# Patient Record
Sex: Male | Born: 1970 | Race: Black or African American | Hispanic: No | Marital: Single | Smoking: Former smoker
Health system: Southern US, Community
[De-identification: ages and names within clinical notes are randomized; demographics above are authoritative.]

## PROBLEM LIST (undated history)

## (undated) DIAGNOSIS — K859 Acute pancreatitis without necrosis or infection, unspecified: Secondary | ICD-10-CM

## (undated) DIAGNOSIS — K7460 Unspecified cirrhosis of liver: Secondary | ICD-10-CM

## (undated) DIAGNOSIS — I10 Essential (primary) hypertension: Secondary | ICD-10-CM

## (undated) HISTORY — PX: ESOPHAGOGASTRODUODENOSCOPY: SHX1529

---
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
## 2019-04-06 ENCOUNTER — Inpatient Hospital Stay (HOSPITAL_COMMUNITY): Payer: Non-veteran care

## 2019-04-06 ENCOUNTER — Inpatient Hospital Stay (HOSPITAL_COMMUNITY)
Admission: AD | Admit: 2019-04-06 | Discharge: 2019-04-24 | DRG: 314 | Disposition: A | Discharge status: Home Health | Payer: Non-veteran care | Source: Other Acute Inpatient Hospital | Attending: Internal Medicine | Admitting: Internal Medicine

## 2019-04-06 DIAGNOSIS — K852 Alcohol induced acute pancreatitis without necrosis or infection: Secondary | ICD-10-CM | POA: Diagnosis present

## 2019-04-06 DIAGNOSIS — K860 Alcohol-induced chronic pancreatitis: Secondary | ICD-10-CM | POA: Diagnosis present

## 2019-04-06 DIAGNOSIS — E872 Acidosis: Secondary | ICD-10-CM | POA: Diagnosis present

## 2019-04-06 DIAGNOSIS — K311 Adult hypertrophic pyloric stenosis: Secondary | ICD-10-CM | POA: Diagnosis present

## 2019-04-06 DIAGNOSIS — E876 Hypokalemia: Secondary | ICD-10-CM | POA: Diagnosis present

## 2019-04-06 DIAGNOSIS — I361 Nonrheumatic tricuspid (valve) insufficiency: Secondary | ICD-10-CM | POA: Diagnosis not present

## 2019-04-06 DIAGNOSIS — I10 Essential (primary) hypertension: Secondary | ICD-10-CM | POA: Diagnosis present

## 2019-04-06 DIAGNOSIS — E11649 Type 2 diabetes mellitus with hypoglycemia without coma: Secondary | ICD-10-CM | POA: Diagnosis not present

## 2019-04-06 DIAGNOSIS — A4102 Sepsis due to Methicillin resistant Staphylococcus aureus: Secondary | ICD-10-CM | POA: Diagnosis present

## 2019-04-06 DIAGNOSIS — E1165 Type 2 diabetes mellitus with hyperglycemia: Secondary | ICD-10-CM | POA: Diagnosis present

## 2019-04-06 DIAGNOSIS — F1010 Alcohol abuse, uncomplicated: Secondary | ICD-10-CM | POA: Diagnosis present

## 2019-04-06 DIAGNOSIS — K567 Ileus, unspecified: Secondary | ICD-10-CM | POA: Diagnosis present

## 2019-04-06 DIAGNOSIS — K7031 Alcoholic cirrhosis of liver with ascites: Secondary | ICD-10-CM | POA: Diagnosis present

## 2019-04-06 DIAGNOSIS — Z87891 Personal history of nicotine dependence: Secondary | ICD-10-CM | POA: Diagnosis not present

## 2019-04-06 DIAGNOSIS — Z20828 Contact with and (suspected) exposure to other viral communicable diseases: Secondary | ICD-10-CM | POA: Diagnosis present

## 2019-04-06 DIAGNOSIS — Y848 Other medical procedures as the cause of abnormal reaction of the patient, or of later complication, without mention of misadventure at the time of the procedure: Secondary | ICD-10-CM | POA: Diagnosis present

## 2019-04-06 DIAGNOSIS — D509 Iron deficiency anemia, unspecified: Secondary | ICD-10-CM | POA: Diagnosis present

## 2019-04-06 DIAGNOSIS — F1011 Alcohol abuse, in remission: Secondary | ICD-10-CM | POA: Diagnosis present

## 2019-04-06 DIAGNOSIS — Z4659 Encounter for fitting and adjustment of other gastrointestinal appliance and device: Secondary | ICD-10-CM

## 2019-04-06 DIAGNOSIS — A419 Sepsis, unspecified organism: Secondary | ICD-10-CM | POA: Diagnosis present

## 2019-04-06 DIAGNOSIS — I8510 Secondary esophageal varices without bleeding: Secondary | ICD-10-CM | POA: Diagnosis present

## 2019-04-06 DIAGNOSIS — Z803 Family history of malignant neoplasm of breast: Secondary | ICD-10-CM | POA: Diagnosis not present

## 2019-04-06 DIAGNOSIS — I339 Acute and subacute endocarditis, unspecified: Secondary | ICD-10-CM | POA: Diagnosis not present

## 2019-04-06 DIAGNOSIS — B9562 Methicillin resistant Staphylococcus aureus infection as the cause of diseases classified elsewhere: Secondary | ICD-10-CM | POA: Diagnosis not present

## 2019-04-06 DIAGNOSIS — K766 Portal hypertension: Secondary | ICD-10-CM | POA: Diagnosis present

## 2019-04-06 DIAGNOSIS — T80211A Bloodstream infection due to central venous catheter, initial encounter: Principal | ICD-10-CM | POA: Diagnosis present

## 2019-04-06 DIAGNOSIS — K863 Pseudocyst of pancreas: Secondary | ICD-10-CM | POA: Diagnosis present

## 2019-04-06 DIAGNOSIS — Z978 Presence of other specified devices: Secondary | ICD-10-CM

## 2019-04-06 DIAGNOSIS — I330 Acute and subacute infective endocarditis: Secondary | ICD-10-CM | POA: Diagnosis present

## 2019-04-06 DIAGNOSIS — K7460 Unspecified cirrhosis of liver: Secondary | ICD-10-CM | POA: Diagnosis present

## 2019-04-06 DIAGNOSIS — R7881 Bacteremia: Secondary | ICD-10-CM | POA: Diagnosis not present

## 2019-04-06 DIAGNOSIS — E871 Hypo-osmolality and hyponatremia: Secondary | ICD-10-CM | POA: Diagnosis present

## 2019-04-06 DIAGNOSIS — I342 Nonrheumatic mitral (valve) stenosis: Secondary | ICD-10-CM | POA: Diagnosis not present

## 2019-04-06 DIAGNOSIS — E119 Type 2 diabetes mellitus without complications: Secondary | ICD-10-CM

## 2019-04-06 DIAGNOSIS — E43 Unspecified severe protein-calorie malnutrition: Secondary | ICD-10-CM | POA: Diagnosis present

## 2019-04-06 DIAGNOSIS — K859 Acute pancreatitis without necrosis or infection, unspecified: Secondary | ICD-10-CM | POA: Diagnosis present

## 2019-04-06 HISTORY — DX: Essential (primary) hypertension: I10

## 2019-04-06 LAB — COMPREHENSIVE METABOLIC PANEL
ALT: 136 U/L — ABNORMAL HIGH (ref 0–44)
AST: 203 U/L — ABNORMAL HIGH (ref 15–41)
Albumin: 1.6 g/dL — ABNORMAL LOW (ref 3.5–5.0)
Alkaline Phosphatase: 109 U/L (ref 38–126)
Anion gap: 14 (ref 5–15)
BUN: 24 mg/dL — ABNORMAL HIGH (ref 6–20)
CO2: 15 mmol/L — ABNORMAL LOW (ref 22–32)
Calcium: 7.6 mg/dL — ABNORMAL LOW (ref 8.9–10.3)
Chloride: 92 mmol/L — ABNORMAL LOW (ref 98–111)
Creatinine, Ser: 1.16 mg/dL (ref 0.61–1.24)
GFR calc Af Amer: >60 mL/min (ref >60)
GFR calc non Af Amer: >60 mL/min (ref >60)
Glucose, Bld: 253 mg/dL — ABNORMAL HIGH (ref 70–99)
Potassium: 3.1 mmol/L — ABNORMAL LOW (ref 3.5–5.1)
Sodium: 121 mmol/L — ABNORMAL LOW (ref 135–145)
Total Bilirubin: 2.2 mg/dL — ABNORMAL HIGH (ref 0.3–1.2)
Total Protein: 6.6 g/dL (ref 6.5–8.1)

## 2019-04-06 LAB — DIFFERENTIAL
Abs Immature Granulocytes: 0 10*3/uL (ref 0.00–0.07)
Basophils Absolute: 0.2 10*3/uL — ABNORMAL HIGH (ref 0.0–0.1)
Basophils Relative: 1 %
Eosinophils Absolute: 0 10*3/uL (ref 0.0–0.5)
Eosinophils Relative: 0 %
Lymphocytes Relative: 3 %
Lymphs Abs: 0.7 10*3/uL (ref 0.7–4.0)
Monocytes Absolute: 0.4 10*3/uL (ref 0.1–1.0)
Monocytes Relative: 2 %
Neutro Abs: 20.7 10*3/uL — ABNORMAL HIGH (ref 1.7–7.7)
Neutrophils Relative %: 94 %
nRBC: 0 /100 WBC

## 2019-04-06 LAB — CBC
HCT: 27.1 % — ABNORMAL LOW (ref 39.0–52.0)
Hemoglobin: 8.8 g/dL — ABNORMAL LOW (ref 13.0–17.0)
MCH: 24.7 pg — ABNORMAL LOW (ref 26.0–34.0)
MCHC: 32.5 g/dL (ref 30.0–36.0)
MCV: 76.1 fL — ABNORMAL LOW (ref 80.0–100.0)
Platelets: 191 10*3/uL (ref 150–400)
RBC: 3.56 MIL/uL — ABNORMAL LOW (ref 4.22–5.81)
RDW: 19 % — ABNORMAL HIGH (ref 11.5–15.5)
WBC: 22 10*3/uL — ABNORMAL HIGH (ref 4.0–10.5)
nRBC: 0 % (ref 0.0–0.2)

## 2019-04-06 LAB — BETA-HYDROXYBUTYRIC ACID: Beta-Hydroxybutyric Acid: 0.17 mmol/L (ref 0.05–0.27)

## 2019-04-06 LAB — CORTISOL: Cortisol, Plasma: 33.7 ug/dL

## 2019-04-06 LAB — LIPASE, BLOOD: Lipase: 52 U/L — ABNORMAL HIGH (ref 11–51)

## 2019-04-06 LAB — GLUCOSE, CAPILLARY
Glucose-Capillary: 197 mg/dL — ABNORMAL HIGH (ref 70–99)
Glucose-Capillary: 235 mg/dL — ABNORMAL HIGH (ref 70–99)
Glucose-Capillary: 264 mg/dL — ABNORMAL HIGH (ref 70–99)

## 2019-04-06 LAB — HIV ANTIBODY (ROUTINE TESTING W REFLEX): HIV Screen 4th Generation wRfx: NONREACTIVE

## 2019-04-06 LAB — PHOSPHORUS: Phosphorus: 3.1 mg/dL (ref 2.5–4.6)

## 2019-04-06 LAB — MRSA PCR SCREENING: MRSA by PCR: POSITIVE — AB

## 2019-04-06 LAB — MAGNESIUM: Magnesium: 1.6 mg/dL — ABNORMAL LOW (ref 1.7–2.4)

## 2019-04-06 LAB — HEMOGLOBIN A1C
Hgb A1c MFr Bld: 5.8 % — ABNORMAL HIGH (ref 4.8–5.6)
Mean Plasma Glucose: 119.76 mg/dL

## 2019-04-06 LAB — AMYLASE: Amylase: 180 U/L — ABNORMAL HIGH (ref 28–100)

## 2019-04-06 MED ORDER — CHLORHEXIDINE GLUCONATE CLOTH 2 % EX PADS
6.0000 | MEDICATED_PAD | Freq: Every day | CUTANEOUS | Status: AC
Start: 2019-04-07 — End: 2019-04-12
  Administered 2019-04-09 – 2019-04-11 (×3): 6 via TOPICAL

## 2019-04-06 MED ORDER — ALTEPLASE 2 MG IJ SOLR
2.0000 mg | Freq: Once | INTRAMUSCULAR | Status: AC
  Administered 2019-04-07: 01:00:00 2 mg
  Filled 2019-04-06: qty 2

## 2019-04-06 MED ORDER — VANCOMYCIN HCL IN DEXTROSE 750-5 MG/150ML-% IV SOLN
750.0000 mg | Freq: Three times a day (TID) | INTRAVENOUS | Status: DC
  Administered 2019-04-06 – 2019-04-07 (×2): 750 mg via INTRAVENOUS
  Filled 2019-04-06 (×3): qty 150

## 2019-04-06 MED ORDER — FENTANYL CITRATE (PF) 100 MCG/2ML IJ SOLN
25.0000 ug | Freq: Once | INTRAMUSCULAR | Status: AC
  Administered 2019-04-06: 25 ug via INTRAVENOUS
  Filled 2019-04-06: qty 2

## 2019-04-06 MED ORDER — ENOXAPARIN SODIUM 40 MG/0.4ML ~~LOC~~ SOLN: 40.0000 mg | SUBCUTANEOUS | Status: DC

## 2019-04-06 MED ORDER — INSULIN ASPART 100 UNIT/ML ~~LOC~~ SOLN: 0.0000 [IU] | Freq: Four times a day (QID) | SUBCUTANEOUS | Status: DC

## 2019-04-06 MED ORDER — ACETAMINOPHEN 325 MG PO TABS: 650.0000 mg | ORAL_TABLET | ORAL | Status: DC | PRN

## 2019-04-06 MED ORDER — ONDANSETRON HCL 4 MG/2ML IJ SOLN
4.0000 mg | Freq: Four times a day (QID) | INTRAMUSCULAR | Status: DC | PRN
  Administered 2019-04-06: 19:00:00 4 mg via INTRAVENOUS
  Filled 2019-04-06: qty 2

## 2019-04-06 MED ORDER — MUPIROCIN 2 % EX OINT
1.0000 "application " | TOPICAL_OINTMENT | Freq: Two times a day (BID) | CUTANEOUS | Status: AC
  Administered 2019-04-06 – 2019-04-11 (×10): 1 via NASAL
  Filled 2019-04-06 (×3): qty 22

## 2019-04-06 MED ORDER — LACTATED RINGERS IV SOLN
INTRAVENOUS | Status: DC
  Administered 2019-04-06 – 2019-04-11 (×7): via INTRAVENOUS

## 2019-04-06 MED ORDER — SODIUM CHLORIDE 0.9 % IV SOLN
1.0000 g | Freq: Three times a day (TID) | INTRAVENOUS | Status: DC
  Administered 2019-04-07: 04:00:00 1 g via INTRAVENOUS
  Filled 2019-04-06 (×3): qty 1

## 2019-04-06 MED ORDER — HYDROMORPHONE HCL 1 MG/ML IJ SOLN
0.5000 mg | INTRAMUSCULAR | Status: DC | PRN
  Administered 2019-04-06 – 2019-04-09 (×17): 0.5 mg via INTRAVENOUS
  Filled 2019-04-06 (×17): qty 0.5

## 2019-04-06 MED ORDER — INSULIN ASPART 100 UNIT/ML ~~LOC~~ SOLN
0.0000 [IU] | SUBCUTANEOUS | Status: DC
  Administered 2019-04-06: 3 [IU] via SUBCUTANEOUS
  Administered 2019-04-06: 5 [IU] via SUBCUTANEOUS
  Administered 2019-04-07 (×2): 2 [IU] via SUBCUTANEOUS
  Administered 2019-04-07: 04:00:00 3 [IU] via SUBCUTANEOUS

## 2019-04-06 MED ORDER — SODIUM CHLORIDE 0.9 % IV SOLN
1.0000 g | Freq: Once | INTRAVENOUS | Status: AC
  Administered 2019-04-06: 1 g via INTRAVENOUS
  Filled 2019-04-06: qty 1

## 2019-04-06 MED ORDER — FAMOTIDINE IN NACL 20-0.9 MG/50ML-% IV SOLN
20.0000 mg | Freq: Two times a day (BID) | INTRAVENOUS | Status: DC
  Administered 2019-04-06: 20 mg via INTRAVENOUS
  Filled 2019-04-06: qty 50

## 2019-04-06 NOTE — Progress Notes (Signed)
Pharmacy Antibiotic Note  Charles Calhoun is a 48 y.o. male admitted on 04/06/2019 as a transfer from [facility] for higher level of care in setting of suspected sepsis, enlarging pancreatis pseudocysts, probable ileus. Pharmacy has been consulted for vancomycin dosing.  Per pharmacist at [facility], pt rec'd Zosyn and vancomycin (1 gram) IV at ~0800 today.  Plan: Vancomycin 750 mg IV Q 8 hrs Monitor renal function, WBC, temp, clinical improvement  Height: 6' (182.9 cm) Weight: 189 lb 6 oz (85.9 kg) IBW/kg (Calculated) : 77.6  Temp (24hrs), Avg:99.7 F (37.6 C), Min:99.3 F (37.4 C), Max:100 F (37.8 C)  Estimated Creatinine Clearance: 83.6 mL/min (by C-G formula based on SCr of 1.16 mg/dL).    No Known Allergies  Thanks for allowing pharmacy to be a part of this patient's care.  Excell Seltzer, PharmD Clinical Pharmacist

## 2019-04-06 NOTE — H&P (Addendum)
NAME:  Charles Calhoun, MRN:  [id number], DOB:  27-May-1971, LOS: 0 ADMISSION DATE:  04/06/2019, CONSULTATION DATE:  04/06/19 REFERRING MD:  Barnabas Harries, CHIEF COMPLAINT:  Abdominal pain  Brief History   48 yo M transferred from [facility] to [facility]U for higher level of care in setting of suspected sepsis, enlarging pancreatic pseudocysts.   History of present illness   48 yo M PMH EtOH abuse, Pancreatitis, who presented to [facility] with CC SOB. SOB began 2 days prior to presentation, onset gradually, and with associated symptoms of anorexia, abdominal pain, nausea, weakness and fever. Pain is dull in quality and moderate. No alleviating factors. Patient denies chest pain, vomiting, sick contacts, diarrhea, constipation.  CT abdomen concerning for acute pancreatitis and interval increase in size of pancreatic pseudocysts. Of note, patient with acute pancreatitis 9/26 for which he is receiving TPN via R PICC.  Glu 597, K 5.3, Ca 6.9, Sodium 112 AST 419 ALT 178 Alk phos 146 TBili 2.4  WBC 22,  Hgb  7.5 Lactic acid 6.0  PCT 15.85, BNP 142  Received vanc, zosyn at [facility]. Evaluated by surgeon who determined patient required higher level of care for medical management. Received 2 PRBC for acute anemia. Patient transferred to [facility].   Past Medical History  EtOH abuse Pancreatitis Pancreatic pseudocyst  DM with DKA   Significant Hospital Events   10/29 transferred from [facility] to [facility] MICU.  Consults:  Will need IR consult in AM.   Procedures:    Significant Diagnostic Tests:   10/29 CXR> R PICC. No acute pulmonary abnormalities   10/29 CT a/p at [facility] enlarging pancreatic pseudocysts (10.3 x 6.7 cm from 7.8 x 6cm) with new fluid collection along lesser curvature of stomach. Second collection near posterior main portal vein 5.6cm (previously 5.1cm) Localized mass effect on gastric antrum with fluid in stomach and esophagus-- suspicious for developing gastric outlet obstruction.  Increased peripancreatic stranding concerning for acute pancreatitis. Mild ascites.   Micro Data:  10/29 SARS Cov2> negative (performed at [facility])   Antimicrobials:  10/29 Vanc 10/29 Zosyn  10/29 Meropenem >>>   Interim history/subjective:  Arrives tachycardic but otherwise HDS On RA with some tachypnea Complaints of dull abdominal pain   Objective   Temperature 100 F (37.8 C), temperature source Oral, height 6' (1.829 m), weight 85.9 kg.       No intake or output data in the 24 hours ending 04/06/19 1647 Filed Weights   04/06/19 1500  Weight: 85.9 kg    Examination: General: Well-developed, thin adult male. Appears uncomfortable  HENT: Normocephalic, PERRL. Dry mucus membranes Neck: No JVD. Trachea midline. No thyromegaly, no lymphadenopathy CV: Tachycardic. RRR. S1S2. No MRG. +2 distal pulses Lungs: BBS present, clear, FNL, symmetrical. Tachyneic  ABD: Rounded, protuberant, hypoacitve BS x4. Tense. + pain to light palp diffusely, worse over epigastirc region. No masses or rigidity GU: No Foley EXT: MAE well. No edema. RUE PICC  Skin: Pale, WD. In tact. No rashes or lesions Neuro: A&Ox3. CN II-XII in tact. 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 Has been on TPN out pt since September 2020 /p d/c from [facility] for treatment of same.  P Continue NPO and IVF resuscitation  IR consult for perc drain 10/30  AM  NGT to [place] consult for TPN (arrives with existing PICC for TPN) Pharmacy consult for meropenem Lactic acid Procal  CMP CBC INR Pain management  KUB in AM  Hyperglycemia ?DKA at [place] Check BMP mag phos  Check beta hydroxybuteric acid  CBG q4   Hx EtOH abuse -outside of window for dts , no Hx dts   P -nutrition via TPN per pharmacy   At risk electrolyte abnormality P -Follow up BMP mag phos, replace as indicated  Best practice:  Diet: TPN per pharmacy Pain/Anxiety/Delirium protocol (if indicated): PRN dialudid VAP protocol (if indicated): na DVT prophylaxis: SCD GI prophylaxis: Pepcid Glucose control: SSI  Mobility: Up to chair Code Status: Full  Family Communication: patient updated at bedside  Disposition: transferred from [facility] to [facility]U   Labs   CBC: No results for input(s): WBC, NEUTROABS, HGB, HCT, MCV, PLT in the last 168 hours.  Basic Metabolic Panel: No results for input(s): NA, K, CL, CO2, GLUCOSE, BUN, CREATININE, CALCIUM, MG, PHOS in the last 168 hours. GFR: CrCl cannot be calculated (No successful lab value found.). No results for input(s): PROCALCITON, WBC, LATICACIDVEN in the last 168 hours.  Liver Function Tests: No results for input(s): AST, ALT, ALKPHOS, BILITOT, PROT, ALBUMIN in the last 168 hours. No results for input(s): LIPASE, AMYLASE in the last 168 hours. No results for input(s): AMMONIA in the last 168 hours.  ABG No results found for: PHART, PCO2ART, PO2ART, HCO3, TCO2, ACIDBASEDEF, O2SAT   Coagulation Profile: No results for input(s): INR, PROTIME in the last 168 hours.  Cardiac Enzymes: No results for input(s): CKTOTAL, CKMB, CKMBINDEX, TROPONINI in the last 168 hours.  HbA1C: No results found for: HGBA1C  CBG: Recent Labs  Lab 04/06/19 1645  GLUCAP 264*    Review of Systems:   As per HPI   Past Medical History  He,  has no past medical history on file.   Surgical History   EGD with bx of pancreatic mass  Social History     Hx ETOH abuse x 35 years, with cessation 01/2019 Family History   His family history is not on file.   Allergies No Known Allergies   Home Medications  Prior to Admission medications   Not on File     Critical care time: 45 min     Eliseo Gum MSN, AGACNP-BC [place] [phone] If no answer, [phone] 04/06/2019, 5:50 PM

## 2019-04-06 NOTE — Progress Notes (Signed)
Pharmacy Antibiotic Note  Charles Calhoun is a 48 y.o. male admitted on 04/06/2019 as a transfer from [facility] for higher level of care in setting of suspected sepsis, enlarging pancreatis pseudocysts, probable ileus. Pharmacy has been consulted for meropenem dosing.  Medical history includes: acute pancreatitis (03/04/19), EtOH abuse, DM with hx DKA  WBC 22, Scr 1.16, CrCl 83.6 ml/min, temp 100 F  Per pharmacist at [facility], pt rec'd Zosyn and vancomycin (1 gram) IV at ~0800 today.  Plan: Meropenem 1 gm IV Q 8 hrs Monitor renal function, WBC, temp, clinical improvement  Height: 6' (182.9 cm) Weight: 189 lb 6 oz (85.9 kg) IBW/kg (Calculated) : 77.6  Temp (24hrs), Avg:100 F (37.8 C), Min:100 F (37.8 C), Max:100 F (37.8 C)  Estimated Creatinine Clearance: 83.6 mL/min (by C-G formula based on SCr of 1.16 mg/dL).    No Known Allergies  Microbiology results: 10/29 BCx X 2: pending 10/29 COVID: negative (at OSH)   Thank you for allowing pharmacy to be a part of this patient's care.  Gillermina Hu, PharmD, BCPS, [facility] Clinical Pharmacist 04/06/2019 6:08 PM

## 2019-04-07 ENCOUNTER — Inpatient Hospital Stay (HOSPITAL_COMMUNITY): Payer: Non-veteran care

## 2019-04-07 ENCOUNTER — Encounter (HOSPITAL_COMMUNITY): Payer: Self-pay | Admitting: Infectious Diseases

## 2019-04-07 DIAGNOSIS — K863 Pseudocyst of pancreas: Secondary | ICD-10-CM

## 2019-04-07 DIAGNOSIS — B9562 Methicillin resistant Staphylococcus aureus infection as the cause of diseases classified elsewhere: Secondary | ICD-10-CM | POA: Diagnosis present

## 2019-04-07 DIAGNOSIS — I340 Nonrheumatic mitral (valve) insufficiency: Secondary | ICD-10-CM

## 2019-04-07 DIAGNOSIS — E871 Hypo-osmolality and hyponatremia: Secondary | ICD-10-CM

## 2019-04-07 DIAGNOSIS — K859 Acute pancreatitis without necrosis or infection, unspecified: Secondary | ICD-10-CM

## 2019-04-07 DIAGNOSIS — A419 Sepsis, unspecified organism: Secondary | ICD-10-CM

## 2019-04-07 DIAGNOSIS — K7460 Unspecified cirrhosis of liver: Secondary | ICD-10-CM

## 2019-04-07 DIAGNOSIS — K861 Other chronic pancreatitis: Secondary | ICD-10-CM

## 2019-04-07 DIAGNOSIS — E440 Moderate protein-calorie malnutrition: Secondary | ICD-10-CM

## 2019-04-07 DIAGNOSIS — R7881 Bacteremia: Principal | ICD-10-CM | POA: Diagnosis present

## 2019-04-07 HISTORY — DX: Unspecified cirrhosis of liver: K74.60

## 2019-04-07 HISTORY — DX: Acute pancreatitis without necrosis or infection, unspecified: K85.90

## 2019-04-07 LAB — URINE CULTURE: Culture: NO GROWTH

## 2019-04-07 LAB — COMPREHENSIVE METABOLIC PANEL
ALT: 90 U/L — ABNORMAL HIGH (ref 0–44)
AST: 107 U/L — ABNORMAL HIGH (ref 15–41)
Albumin: 1.3 g/dL — ABNORMAL LOW (ref 3.5–5.0)
Alkaline Phosphatase: 82 U/L (ref 38–126)
Anion gap: 9 (ref 5–15)
BUN: 17 mg/dL (ref 6–20)
CO2: 17 mmol/L — ABNORMAL LOW (ref 22–32)
Calcium: 6.8 mg/dL — ABNORMAL LOW (ref 8.9–10.3)
Chloride: 103 mmol/L (ref 98–111)
Creatinine, Ser: 0.72 mg/dL (ref 0.61–1.24)
GFR calc Af Amer: >60 mL/min (ref >60)
GFR calc non Af Amer: >60 mL/min (ref >60)
Glucose, Bld: 136 mg/dL — ABNORMAL HIGH (ref 70–99)
Potassium: 2.9 mmol/L — ABNORMAL LOW (ref 3.5–5.1)
Sodium: 129 mmol/L — ABNORMAL LOW (ref 135–145)
Total Bilirubin: 1.5 mg/dL — ABNORMAL HIGH (ref 0.3–1.2)
Total Protein: 5.3 g/dL — ABNORMAL LOW (ref 6.5–8.1)

## 2019-04-07 LAB — BASIC METABOLIC PANEL
Anion gap: 6 (ref 5–15)
Anion gap: 8 (ref 5–15)
BUN: 14 mg/dL (ref 6–20)
BUN: 12 mg/dL (ref 6–20)
CO2: 23 mmol/L (ref 22–32)
CO2: 21 mmol/L — ABNORMAL LOW (ref 22–32)
Calcium: 7.7 mg/dL — ABNORMAL LOW (ref 8.9–10.3)
Calcium: 7.5 mg/dL — ABNORMAL LOW (ref 8.9–10.3)
Chloride: 100 mmol/L (ref 98–111)
Chloride: 100 mmol/L (ref 98–111)
Creatinine, Ser: 0.76 mg/dL (ref 0.61–1.24)
Creatinine, Ser: 0.78 mg/dL (ref 0.61–1.24)
GFR calc Af Amer: >60 mL/min (ref >60)
GFR calc Af Amer: >60 mL/min (ref >60)
GFR calc non Af Amer: >60 mL/min (ref >60)
GFR calc non Af Amer: >60 mL/min (ref >60)
Glucose, Bld: 129 mg/dL — ABNORMAL HIGH (ref 70–99)
Glucose, Bld: 120 mg/dL — ABNORMAL HIGH (ref 70–99)
Potassium: 4.2 mmol/L (ref 3.5–5.1)
Potassium: 4.3 mmol/L (ref 3.5–5.1)
Sodium: 129 mmol/L — ABNORMAL LOW (ref 135–145)
Sodium: 129 mmol/L — ABNORMAL LOW (ref 135–145)

## 2019-04-07 LAB — DIFFERENTIAL
Abs Immature Granulocytes: 0.15 10*3/uL — ABNORMAL HIGH (ref 0.00–0.07)
Basophils Absolute: 0 10*3/uL (ref 0.0–0.1)
Basophils Relative: 0 %
Eosinophils Absolute: 0 10*3/uL (ref 0.0–0.5)
Eosinophils Relative: 0 %
Immature Granulocytes: 1 %
Lymphocytes Relative: 5 %
Lymphs Abs: 0.6 10*3/uL — ABNORMAL LOW (ref 0.7–4.0)
Monocytes Absolute: 1.4 10*3/uL — ABNORMAL HIGH (ref 0.1–1.0)
Monocytes Relative: 12 %
Neutro Abs: 9.2 10*3/uL — ABNORMAL HIGH (ref 1.7–7.7)
Neutrophils Relative %: 82 %

## 2019-04-07 LAB — CBC
HCT: 22.6 % — ABNORMAL LOW (ref 39.0–52.0)
HCT: 23.8 % — ABNORMAL LOW (ref 39.0–52.0)
Hemoglobin: 7.1 g/dL — ABNORMAL LOW (ref 13.0–17.0)
Hemoglobin: 7.6 g/dL — ABNORMAL LOW (ref 13.0–17.0)
MCH: 24.1 pg — ABNORMAL LOW (ref 26.0–34.0)
MCH: 24.4 pg — ABNORMAL LOW (ref 26.0–34.0)
MCHC: 31.4 g/dL (ref 30.0–36.0)
MCHC: 31.9 g/dL (ref 30.0–36.0)
MCV: 76.6 fL — ABNORMAL LOW (ref 80.0–100.0)
MCV: 76.5 fL — ABNORMAL LOW (ref 80.0–100.0)
Platelets: 181 10*3/uL (ref 150–400)
Platelets: 175 10*3/uL (ref 150–400)
RBC: 2.95 MIL/uL — ABNORMAL LOW (ref 4.22–5.81)
RBC: 3.11 MIL/uL — ABNORMAL LOW (ref 4.22–5.81)
RDW: 19 % — ABNORMAL HIGH (ref 11.5–15.5)
RDW: 18.8 % — ABNORMAL HIGH (ref 11.5–15.5)
WBC: 11.3 10*3/uL — ABNORMAL HIGH (ref 4.0–10.5)
WBC: 10.4 10*3/uL (ref 4.0–10.5)
nRBC: 0 % (ref 0.0–0.2)
nRBC: 0 % (ref 0.0–0.2)

## 2019-04-07 LAB — BLOOD CULTURE ID PANEL (REFLEXED)

## 2019-04-07 LAB — GLUCOSE, CAPILLARY
Glucose-Capillary: 151 mg/dL — ABNORMAL HIGH (ref 70–99)
Glucose-Capillary: 140 mg/dL — ABNORMAL HIGH (ref 70–99)
Glucose-Capillary: 125 mg/dL — ABNORMAL HIGH (ref 70–99)
Glucose-Capillary: 119 mg/dL — ABNORMAL HIGH (ref 70–99)
Glucose-Capillary: 116 mg/dL — ABNORMAL HIGH (ref 70–99)

## 2019-04-07 LAB — PREALBUMIN: Prealbumin: <5 mg/dL — ABNORMAL LOW (ref 18–38)

## 2019-04-07 LAB — CALCIUM, IONIZED: Calcium, Ionized, Serum: 4.5 mg/dL (ref 4.5–5.6)

## 2019-04-07 LAB — PHOSPHORUS: Phosphorus: 1.7 mg/dL — ABNORMAL LOW (ref 2.5–4.6)

## 2019-04-07 LAB — ECHOCARDIOGRAM COMPLETE
Height: 72 in
Weight: 3033.53 oz

## 2019-04-07 LAB — PROCALCITONIN: Procalcitonin: 31.27 ng/mL

## 2019-04-07 LAB — TRIGLYCERIDES: Triglycerides: 139 mg/dL (ref <150)

## 2019-04-07 LAB — MAGNESIUM: Magnesium: 1.6 mg/dL — ABNORMAL LOW (ref 1.7–2.4)

## 2019-04-07 MED ORDER — MAGNESIUM SULFATE 2 GM/50ML IV SOLN
2.0000 g | Freq: Once | INTRAVENOUS | Status: AC
  Administered 2019-04-07: 2 g via INTRAVENOUS
  Filled 2019-04-07: qty 50

## 2019-04-07 MED ORDER — POTASSIUM PHOSPHATES 15 MMOLE/5ML IV SOLN
30.0000 mmol | Freq: Once | INTRAVENOUS | Status: AC
  Administered 2019-04-07: 17:00:00 30 mmol via INTRAVENOUS
  Filled 2019-04-07: qty 10

## 2019-04-07 MED ORDER — ENOXAPARIN SODIUM 40 MG/0.4ML ~~LOC~~ SOLN
40.0000 mg | SUBCUTANEOUS | Status: DC
  Administered 2019-04-08 – 2019-04-23 (×16): 40 mg via SUBCUTANEOUS
  Filled 2019-04-07 (×17): qty 0.4

## 2019-04-07 MED ORDER — SODIUM CHLORIDE 0.9 % IV SOLN
2.0000 g | INTRAVENOUS | Status: DC
  Administered 2019-04-07 – 2019-04-16 (×10): 2 g via INTRAVENOUS
  Filled 2019-04-07: qty 20
  Filled 2019-04-07 (×4): qty 2
  Filled 2019-04-07 (×6): qty 20

## 2019-04-07 MED ORDER — THIAMINE HCL 100 MG/ML IJ SOLN
100.0000 mg | Freq: Every day | INTRAMUSCULAR | Status: DC
  Administered 2019-04-07 – 2019-04-12 (×6): 100 mg via INTRAVENOUS
  Filled 2019-04-07 (×6): qty 2

## 2019-04-07 MED ORDER — METRONIDAZOLE IN NACL 5-0.79 MG/ML-% IV SOLN
500.0000 mg | Freq: Three times a day (TID) | INTRAVENOUS | Status: DC
  Administered 2019-04-07 – 2019-04-17 (×30): 500 mg via INTRAVENOUS
  Filled 2019-04-07 (×30): qty 100

## 2019-04-07 MED ORDER — PANTOPRAZOLE SODIUM 40 MG IV SOLR
40.0000 mg | Freq: Two times a day (BID) | INTRAVENOUS | Status: DC
  Administered 2019-04-07 – 2019-04-24 (×35): 40 mg via INTRAVENOUS
  Filled 2019-04-07 (×35): qty 40

## 2019-04-07 MED ORDER — POTASSIUM CHLORIDE 10 MEQ/50ML IV SOLN
10.0000 meq | INTRAVENOUS | Status: AC
  Administered 2019-04-07 (×8): 10 meq via INTRAVENOUS
  Filled 2019-04-07 (×8): qty 50

## 2019-04-07 MED ORDER — VANCOMYCIN HCL 10 G IV SOLR
1500.0000 mg | Freq: Two times a day (BID) | INTRAVENOUS | Status: DC
Start: 2019-04-07 — End: 2019-04-18
  Administered 2019-04-07 – 2019-04-18 (×21): 1500 mg via INTRAVENOUS
  Filled 2019-04-07 (×24): qty 1500

## 2019-04-07 NOTE — Progress Notes (Signed)
PHARMACY - ADULT TOTAL PARENTERAL NUTRITION CONSULT NOTE   Pharmacy Consult for TPN Indication: pancreatitis  Patient Measurements: Height: 6' (182.9 cm) Weight: 189 lb 9.5 oz (86 kg) IBW/kg (Calculated) : 77.6 TPN AdjBW (KG): 85.9 Body mass index is 25.71 kg/m.  Assessment:  48 year old male presented with abdominal pain and vomiting. He has a history of alcohol abuse and cirrhosis. He complained of abdominal pain despite being NPO. Found to have pancreatic pseudocyst, decision was made to treat patient conservatively with NPO status and TPN for 6 weeks. TPN was initiated on 03/04/19 with potential end date of 04/15/19. Patient's care has been at [facility] in [place], [place]. Patient was transferred to [facility] on 10/29 after becoming septic and finding that the pseudocysts have become enlarged.  GI: On home TPN through [facility] in [place] Endo: cbgs 140-235 Insulin requirements in the past 24 hours: 11 units ssi Lytes:  Renal: Pulm: On RA Cards:  Hepatobil: AST/ALT 203/136 > 107/90, Tbili 1.5 Neuro: A&O ID: MRSA bacteremia on vancomycin, flagyl, ceftriaxone  TPN Access: PICC to be pulled for bacteremia, will need new PICC prior to restarting TPN TPN start date:  Nutritional Goals (per RD recommendation on ): kCal: Protein:  Fluid:   Current Nutrition:  NPO  Plan:  -Due to bacteremia, PICC will be pulled, hold TPN until blood cultures are negative x 48 hours -Awaiting TPN prescription to be faxed from [place]   Abrian Hanover, Jake Church 04/07/2019,9:52 AM

## 2019-04-07 NOTE — Progress Notes (Signed)
Initial Nutrition Assessment  DOCUMENTATION CODES:   Not applicable  INTERVENTION:   Resume TPN when PICC replaced. TPN to meet nutrition needs while bowel rest is required.   NUTRITION DIAGNOSIS:   Increased nutrient needs related to acute illness as evidenced by estimated needs.  GOAL:   Patient will meet greater than or equal to 90% of their needs  MONITOR:   Labs, Weight trends, I & O's  REASON FOR ASSESSMENT:   Consult New TPN/TNA  ASSESSMENT:   48 yo male admitted from [facility] with sepsis, enlarging pancreatic pseudocysts. PMH includes alcoholic pancreatitis, DM, pancreatic pseudocyst (receiving home TPN since 03/04/2019).   IR consulted for drainage of pseudocysts. IR recommends GI consult for possible gastrocystostomy.  PICC is being removed due to MRSA bacteremia. Receiving IV antibiotics. TPN to be resumed when new PICC is placed.   Labs reviewed. Sodium 129 (L), potassium 2.9 (L), phosphorus 1.7 (L), magnesium 1.6 (L), prealbumin <5 (L) CBG's: 151-140  Medications reviewed and include novolog, protonix, thiamine, KCl.    NUTRITION - FOCUSED PHYSICAL EXAM:  unable to complete  Diet Order:   Diet Order            Diet NPO time specified  Diet effective now              EDUCATION NEEDS:   Not appropriate for education at this time  Skin:  Skin Assessment: Reviewed RN Assessment  Last BM:  10/29  Height:   Ht Readings from Last 1 Encounters:  04/06/19 6' (1.829 m)    Weight:   Wt Readings from Last 1 Encounters:  04/07/19 86 kg    Ideal Body Weight:  80.9 kg  BMI:  Body mass index is 25.71 kg/m.  Estimated Nutritional Needs:   Kcal:  9417-4081  Protein:  125-135 gm  Fluid:  >/= 2.4 L    Molli Barrows, RD, LDN, [place] Pager [phone] After Hours Pager [phone]

## 2019-04-07 NOTE — Progress Notes (Signed)
NG tube pulled out as patient was trying to turn. RN will attempt to reinsert tube.

## 2019-04-07 NOTE — Consult Note (Signed)
[facility]    Date of Admission:  04/06/2019     Total days of antibiotics 2  Day 2 vancomycin   Day 1 Ceftriaxone + Metronidazole                 Reason for Consult: MRSA Bacteremia / Pancreatitis     Referring Provider: CHAMP  Primary Care Provider: No primary care provider on file.    Assessment: Charles Calhoun is a 48 y.o. male male transferred to [facility] for management of complex pancreatic pseudocyst. He was found to have MRSA bacteremia at arrival to [facility] on 10/29 in the setting of chronic PICC x 30d. On exam the PICC does not appear infected however it would be considered contaminated and recommend pulling the line and allowing 48h min holiday with repeat cultures to be drawn after removal.   Work up for staphylococcus aureus bacteremia he will also need a transthoracic echocardiogram to assess for endocarditis - on exam he has no murmur (although quite tachycardic presently), no implanted cardiac devices or valves. No artificial joints or other hardware and no signs/symptoms of other metastatic sites of infection. Source most likely related to PICC line; alt possibly related to intraabdominal process, although suspect more GNR/polymicrobial here. Repeat blood cultures ordered to prove clearance. Hold on PICC line for now.   Will change meropenem to ceftriaxone + metronidazole and await for GI team to see with regards to endoscopic cyst gastrostomy. Please send any material for anaerobic/aerobic and fungal cultures to adjust therapy.    Plan: 1. Please pull PICC line and allow for 48h holiday (pending clearance of BCx 2. Please draw blood cultures after PICC line removed 3. Needs GI consult for consideration of endocscopic cyst gastrostomy   4. Continue Vancomycin for MRSA bacteremia 5. Change Meropenem to Ceftriaxone + Metronidazole  6. Transthoracic echo for Staph Bacteremia  7. TPN to be held until we can replace line   Principal  Problem:   MRSA bacteremia Active Problems:   Pancreatitis   Pancreatic pseudocyst   Sepsis (HCC)   Cirrhosis (HCC)    Chlorhexidine Gluconate Cloth  6 each Topical Q0600   [START ON 04/08/2019] enoxaparin (LOVENOX) injection  40 mg Subcutaneous Q24H   insulin aspart  0-15 Units Subcutaneous Q4H   mupirocin ointment  1 application Nasal BID   pantoprazole (PROTONIX) IV  40 mg Intravenous Q12H   thiamine injection  100 mg Intravenous Daily    HPI: Charles Calhoun is a 48 y.o. male [facility] overnight for management of pancreatitis with pseudocyst.   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   Presented to [facility] on 10/29 with chief complaint SOB x 1 week. He was feeling worse and worse and at the urging of his girlfriend he went to the hospital. Review of their records revealed tachycardia 160s, febrile to 102.4 F, hypotension SBP < 90. He improved mildly after IVF and starting broad spectrum antibiotics.   Abdominal CT W Contrast 10/29 - Liver normal size without any gross evidence of masses or dilated ducts. GB unremarkable without stranding. Mild to moderate peripancreatic stranding that is minimally increased compared to study on 9/23. Peripherally enhancing fluid collection along superior mid pancreatic body that is increased to 10.3 x 6.7cm (previously 7.8 x 6cm). Localized mass  effect on gastric antrum. Peripancreatic adenopathy unchanged, likely reactive. Increased gastric wall thickening with small fluid collections along lesser curvature. He is on vancomycin + meropenem.   He states the he had an admission at the mid/end of September for the same problem and has had a double lumen PICC line in the right arm receiving cyclic TNA for about a month. He has not had any  trouble with pain/swelling or tenderness/drainage to this site.   He is passing urine and stool normally. High volume NGT output and +belly pain. No vomiting/nausea. No skin break down. No cardiac devices or hardware of any sort. Denies any back/joint pain.   Review of Systems: Review of Systems  Constitutional: Positive for chills and fever. Negative for malaise/fatigue.  Eyes: Negative for blurred vision and double vision.  Respiratory: Positive for shortness of breath. Negative for cough.   Cardiovascular: Negative for chest pain and leg swelling.  Gastrointestinal: Positive for abdominal pain. Negative for nausea and vomiting.  Genitourinary: Negative for dysuria.  Musculoskeletal: Negative for back pain and joint pain.  Skin: Negative for rash.  Neurological: Positive for dizziness and weakness.    Past Medical History:  Diagnosis Date   Cirrhosis (HCC) 04/07/2019   Pancreatitis 04/07/2019    Social History   Tobacco Use   Smoking status: Not on file  Substance Use Topics   Alcohol use: Not on file   Drug use: Not on file    Family History  Problem Relation Age of Onset   Liver cancer Neg Hx    No Known Allergies  OBJECTIVE: Blood pressure 109/69, pulse (!) 144, temperature 100.1 F (37.8 C), temperature source Oral, resp. rate (!) 26, height 6' (1.829 m), weight 86 kg, SpO2 98 %.  Physical Exam Vitals signs and nursing note reviewed.  Constitutional:      Appearance: Normal appearance.     Comments: Sitting upright in bed. Appears mildly uncomfortable.   HENT:     Mouth/Throat:     Mouth: Mucous membranes are moist.     Pharynx: Oropharynx is clear.  Eyes:     General: No scleral icterus.    Pupils: Pupils are equal, round, and reactive to light.  Cardiovascular:     Rate and Rhythm: Regular rhythm. Tachycardia present.     Heart sounds: No murmur.  Pulmonary:     Effort: Pulmonary effort is normal.     Breath sounds: Normal breath sounds. No  rales.  Abdominal:     General: Bowel sounds are normal.     Palpations: Abdomen is soft.     Comments: NGT in place with brown/bilious output  Musculoskeletal:        General: No tenderness.     Right lower leg: No edema.     Left lower leg: No edema.  Skin:    General: Skin is warm and dry.     Capillary Refill: Capillary refill takes less than 2 seconds.     Comments: RUE DL PICC in place without pain/tenderenss. Dressing dry with Biopatch in place. No obvious purulence.   Neurological:     Mental Status: He is alert and oriented to person, place, and time.  Psychiatric:        Mood and Affect: Mood normal.        Behavior: Behavior normal.     Lab Results Lab Results  Component Value Date   WBC 11.3 (H) 04/07/2019   HGB 7.1 (L) 04/07/2019   HCT 22.6 (L) 04/07/2019  MCV 76.6 (L) 04/07/2019   PLT 181 04/07/2019    Lab Results  Component Value Date   CREATININE 0.72 04/07/2019   BUN 17 04/07/2019   NA 129 (L) 04/07/2019   K 2.9 (L) 04/07/2019   CL 103 04/07/2019   CO2 17 (L) 04/07/2019    Lab Results  Component Value Date   ALT 90 (H) 04/07/2019   AST 107 (H) 04/07/2019   ALKPHOS 82 04/07/2019   BILITOT 1.5 (H) 04/07/2019     Microbiology: Recent Results (from the past 240 hour(s))  Culture, blood (routine x 2)     Status: None (Preliminary result)   Collection Time: 04/06/19  5:25 PM   Specimen: Site Not Specified; Blood  Result Value Ref Range Status   Specimen Description SITE NOT SPECIFIED  Final   Special Requests AEROBIC BOTTLE ONLY Blood Culture adequate volume  Final   Culture  Setup Time   Final    GRAM POSITIVE COCCI IN CLUSTERS AEROBIC BOTTLE ONLY CRITICAL VALUE NOTED.  VALUE IS CONSISTENT WITH PREVIOUSLY REPORTED AND CALLED VALUE.    Culture   Final    NO GROWTH < 24 HOURS Performed at [facility], [address] [place], [place] [address]    Report Status PENDING  Incomplete  Culture, blood (routine x 2)     Status: None  (Preliminary result)   Collection Time: 04/06/19  5:25 PM   Specimen: Site Not Specified; Blood  Result Value Ref Range Status   Specimen Description SITE NOT SPECIFIED  Final   Special Requests AEROBIC BOTTLE ONLY Blood Culture adequate volume  Final   Culture  Setup Time   Final    GRAM POSITIVE COCCI IN CLUSTERS AEROBIC BOTTLE ONLY Organism ID to follow CRITICAL RESULT CALLED TO, READ BACK BY AND VERIFIED WITH: Gala Lewandowsky. Baumeister PharmD 9:15 04/07/19 ([email or web address])    Culture   Final    NO GROWTH < 24 HOURS Performed at [facility], [address] [place], [place] [address]    Report Status PENDING  Incomplete  Blood Culture ID Panel (Reflexed)     Status: Abnormal   Collection Time: 04/06/19  5:25 PM  Result Value Ref Range Status   Enterococcus species NOT DETECTED NOT DETECTED Final   Listeria monocytogenes NOT DETECTED NOT DETECTED Final   Staphylococcus species DETECTED (A) NOT DETECTED Final    Comment: CRITICAL RESULT CALLED TO, READ BACK BY AND VERIFIED WITH: Gala Lewandowsky. Baumeister PharmD 9:15 04/07/19 ([email or web address])    Staphylococcus aureus (BCID) DETECTED (A) NOT DETECTED Final    Comment: Methicillin (oxacillin)-resistant Staphylococcus aureus (MRSA). MRSA is predictably resistant to beta-lactam antibiotics (except ceftaroline). Preferred therapy is vancomycin unless clinically contraindicated. Patient requires contact precautions if  hospitalized. CRITICAL RESULT CALLED TO, READ BACK BY AND VERIFIED WITH: Gala Lewandowsky. Baumeister PharmD 9:15 04/07/19 ([email or web address])    Methicillin resistance DETECTED (A) NOT DETECTED Final    Comment: CRITICAL RESULT CALLED TO, READ BACK BY AND VERIFIED WITH: Gala Lewandowsky. Baumeister PharmD 9:15 04/07/19 ([email or web address])    Streptococcus species NOT DETECTED NOT DETECTED Final   Streptococcus agalactiae NOT DETECTED NOT DETECTED Final   Streptococcus pneumoniae NOT DETECTED NOT DETECTED Final   Streptococcus pyogenes NOT DETECTED NOT DETECTED Final   Acinetobacter baumannii NOT  DETECTED NOT DETECTED Final   Enterobacteriaceae species NOT DETECTED NOT DETECTED Final   Enterobacter cloacae complex NOT DETECTED NOT DETECTED Final   Escherichia coli NOT DETECTED NOT DETECTED Final   Klebsiella oxytoca NOT DETECTED NOT DETECTED  Final   Klebsiella pneumoniae NOT DETECTED NOT DETECTED Final   Proteus species NOT DETECTED NOT DETECTED Final   Serratia marcescens NOT DETECTED NOT DETECTED Final   Haemophilus influenzae NOT DETECTED NOT DETECTED Final   Neisseria meningitidis NOT DETECTED NOT DETECTED Final   Pseudomonas aeruginosa NOT DETECTED NOT DETECTED Final   Candida albicans NOT DETECTED NOT DETECTED Final   Candida glabrata NOT DETECTED NOT DETECTED Final   Candida krusei NOT DETECTED NOT DETECTED Final   Candida parapsilosis NOT DETECTED NOT DETECTED Final   Candida tropicalis NOT DETECTED NOT DETECTED Final    Comment: Performed at [facility], [place] [address] [place], [place] [address]  MRSA PCR Screening     Status: Abnormal   Collection Time: 04/06/19  7:05 PM   Specimen: Nasal Mucosa; Nasopharyngeal  Result Value Ref Range Status   MRSA by PCR POSITIVE (A) NEGATIVE Final    Comment:        The GeneXpert MRSA Assay (FDA approved for NASAL specimens only), is one component of a comprehensive MRSA colonization surveillance program. It is not intended to diagnose MRSA infection nor to guide or monitor treatment for MRSA infections. RESULT CALLED TO, READ BACK BY AND VERIFIED WITH: Floy Sabina RN 04/06/19 2102 JDW Performed at [facility], [place] [address] [place], [place] [address]     Janene Madeira, MSN, NP-C [facility] [email or web address] Pager: [phone] Office: [phone] [place]: [phone]

## 2019-04-07 NOTE — Progress Notes (Signed)
NAME:  Charles Calhoun, MRN:  [id number], DOB:  08-Jul-1970, LOS: 1 ADMISSION DATE:  04/06/2019, CONSULTATION DATE:  04/06/19 REFERRING MD:  Barnabas Harries, CHIEF COMPLAINT:  Abdominal pain  Brief History   48 yo M transferred from [facility] to [facility] MICU for higher level of care in setting of suspected sepsis, enlarging pancreatic pseudocysts.   History of present illness   48 yo M PMH EtOH abuse, Pancreatitis, who presented to [facility] with CC SOB. SOB began 2 days prior to presentation, onset gradually, and with associated symptoms of anorexia, abdominal pain, nausea, weakness and fever. Pain is dull in quality and moderate. No alleviating factors. Patient denies chest pain, vomiting, sick contacts, diarrhea, constipation.  CT abdomen concerning for acute pancreatitis and interval increase in size of pancreatic pseudocysts. Of note, patient with acute pancreatitis 9/26 for which he is receiving TPN via R PICC.  Glu 597, K 5.3, Ca 6.9, Sodium 112 AST 419 ALT 178 Alk phos 146 TBili 2.4  WBC 22,  Hgb  7.5 Lactic acid 6.0  PCT 15.85, BNP 142  Received vanc, zosyn at [facility]. Evaluated by surgeon who determined patient required higher level of care for medical management. Received 2 PRBC for acute anemia. Patient transferred to [facility] MICU.   Past Medical History  EtOH abuse- last drink 2 months ago Pancreatitis Pancreatic pseudocyst  DM with DKA   Significant Hospital Events   10/29 transferred from [facility] to [facility] MICU.  Consults:  Will need IR consult in AM.   Procedures:    Significant Diagnostic Tests:   10/29 CXR> R PICC. No acute pulmonary abnormalities   10/29 CT a/p at [facility] enlarging pancreatic pseudocysts (10.3 x 6.7 cm from 7.8 x 6cm) with new fluid collection along lesser curvature of stomach. Second collection near posterior main portal vein 5.6cm (previously 5.1cm) Localized mass effect on gastric antrum with fluid in stomach and esophagus-- suspicious for developing  gastric outlet obstruction. Increased peripancreatic stranding concerning for acute pancreatitis. Mild ascites.   Micro Data:  10/29 SARS Cov2> negative (performed at [facility])   Antimicrobials:  10/29 Vanc 10/29 Meropenem x 1 dose  Ceftriaxone 10/30>>  Interim history/subjective:  PICC will be removed this afternoon due to staph aureus bacteremia.  Currently denies abdominal pain or nausea.  Objective   Blood pressure 99/61, pulse (!) 142, temperature 100.1 F (37.8 C), temperature source Oral, resp. rate (!) 28, height 6' (1.829 m), weight 86 kg, SpO2 93 %.        Intake/Output Summary (Last 24 hours) at 04/07/2019 1258 Last data filed at 04/07/2019 1000 Gross per 24 hour  Intake 1102.35 ml  Output 1950 ml  Net -847.65 ml   Filed Weights   04/06/19 1500 04/07/19 0500  Weight: 85.9 kg 86 kg    Examination: General: Chronically ill-appearing, thin.  Lying in bed no acute distress HENT: [place]/AT, eyes anicteric  neck: No JVD CV:  Tachycardic, regular rhythm.  Sinus tachycardia on telemetry.  No murmurs.   Lungs: Breathing comfortably on room air, mild tachypnea.  No accessory muscle use.  Clear to auscultation bilaterally.   ABD:  Protuberant abdomen, mildly distended.  Soft, minimally tender to palpation EXT: Right upper extremity PICC without erythema or drainage.  No redness along the tract.  No significant peripheral edema Skin: No rashes or wounds Neuro: Alert, answering questions appropriately, normal speech.  Face symmetric, moving all extremities spontaneously.   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 Has been on TPN out pt since September 2020 /p d/c from [facility] for treatment of same.   Sepsis due to likely infected pancreatic pseudocyst vs Staph  aureus bacteremia.  Although his PICC does not appear infected, this is likely the source of bacteremia. -IR evaluating for possible drainage; needs to be cultured if drained -Continue broad-spectrum antibiotics -Blood cultures pending -Echocardiogram -Appreciate infectious disease team's assistance -PICC to be removed today; will hold TPN and secure through peripheral IVs for line holiday  Likely ileus -Continue n.p.o.  Hyperglycemia-well-controlled -Continue Accu-Cheks every 4 hours with sliding scale insulin as needed -Goal BG 140-180 while admitted  Hx EtOH abuse; last drink 2 months ago P -Will restart TPN once central access is available -IV thiamine  Hyponatremia-corrected by 8 mg/dL over 12 hours -Serial BMPs, every 6 hours -Continue LR at current rate; may need to adjust based on sodium levels  Hypokalemia -replete & to monitor  Non-anion gap metabolic acidosis, possibly due to hyperalimentation -Continue to monitor  Acute on chronic anemia; drop overnight does not appear dilutional -Serial CBCs -Type and screen this afternoon -Will transfuse for hemoglobin less than 7 or active bleeding with hemodynamic instability  Sinus tachycardia due to critical illness -Continue to monitor   Best practice:  Diet: TPN per pharmacy- on hold Pain/Anxiety/Delirium protocol (if indicated): PRN dialudid VAP protocol (if indicated): na DVT prophylaxis: SCD; due to hemoglobin drop chemical DVT prophylaxis is contraindicated currently GI prophylaxis: PPI Glucose control: SSI  Mobility: Up to chair Code Status: Full  Family Communication: patient updated at bedside  Disposition: ICU  Labs   CBC: Recent Labs  Lab 04/06/19 1840 04/07/19 0420  WBC 22.0* 11.3*  NEUTROABS 20.7* 9.2*  HGB 8.8* 7.1*  HCT 27.1* 22.6*  MCV 76.1* 76.6*  PLT 191 244    Basic Metabolic Panel: Recent Labs  Lab 04/06/19 1830 04/07/19 0421  NA 121* 129*  K 3.1* 2.9*  CL 92* 103  CO2 15*  17*  GLUCOSE 253* 136*  BUN 24* 17  CREATININE 1.16 0.72  CALCIUM 7.6* 6.8*  MG 1.6* 1.6*  PHOS 3.1 1.7*   GFR: Estimated Creatinine Clearance: 121.3 mL/min (by C-G formula based on SCr of 0.72 mg/dL). Recent Labs  Lab 04/06/19 1830 04/06/19 1840 04/07/19 0420  PROCALCITON 31.27  --   --   WBC  --  22.0* 11.3*    Liver Function Tests: Recent Labs  Lab 04/06/19 1830 04/07/19 0421  AST 203* 107*  ALT 136* 90*  ALKPHOS 109 82  BILITOT 2.2* 1.5*  PROT 6.6 5.3*  ALBUMIN 1.6* 1.3*   Recent Labs  Lab 04/06/19 1830  LIPASE 52*  AMYLASE 180*   No results for input(s): AMMONIA in the last 168 hours.  ABG No results found for: PHART, PCO2ART, PO2ART, HCO3, TCO2, ACIDBASEDEF, O2SAT   Coagulation Profile: No results for input(s): INR, PROTIME in the last 168 hours.  Cardiac Enzymes: No results for input(s): CKTOTAL, CKMB, CKMBINDEX, TROPONINI in the last 168 hours.  HbA1C: Hgb A1c MFr Bld  Date/Time Value Ref Range Status  04/06/2019 06:40 PM 5.8 (H) 4.8 - 5.6 % Final    Comment:    (NOTE) Pre diabetes:          5.7%-6.4% Diabetes:              >  6.4% Glycemic control for   <7.0% adults with diabetes     CBG: Recent Labs  Lab 04/06/19 2042 04/06/19 2332 04/07/19 0337 04/07/19 0742 04/07/19 1134  GLUCAP 235* 197* 151* 140* 125*     This patient is critically ill with multiple organ system failure which requires frequent high complexity decision making, assessment, support, evaluation, and titration of therapies. This was completed through the application of advanced monitoring technologies and extensive interpretation of multiple databases. During this encounter critical care time was devoted to patient care services described in this note for 45 minutes.

## 2019-04-07 NOTE — Progress Notes (Signed)
Echocardiogram 2D Echocardiogram has been performed.  Oneal Deputy Shaniqwa Horsman 04/07/2019, 1:58 PM

## 2019-04-07 NOTE — Progress Notes (Signed)
PHARMACY - PHYSICIAN COMMUNICATION CRITICAL VALUE ALERT - BLOOD CULTURE IDENTIFICATION (BCID)  Charles Calhoun is an 48 y.o. male who presented to [facility] on 04/06/2019 from [facility] with a chief complaint of abdominal pain with associated pancreatitis and pseudocysts  Assessment:  2/2 aerobic bottles positive for MRSA  Name of physician (or Provider) Contacted: Dr. Baxter Flattery  Current antibiotics: Vancomycin and meropenem  Changes to prescribed antibiotics recommended:  Will discontinue meropenem and initiate ceftriaxone and flagyl. Continue the vancomycin  No results found for this or any previous visit.  [name] 04/07/2019  9:11 AM

## 2019-04-07 NOTE — Consult Note (Signed)
Chief Complaint: Patient was seen in consultation today for pancreatic pseudocysts  Referring Physician(s): Dr. Lynetta Mare  Supervising Physician: Markus Daft  Patient Status: [facility] - In-pt  History of Present Illness: Charles Calhoun is a 48 y.o. male with history of cirrhosis, alcohol abuse, pancreatitis who was admitted in September to [facility] for abdominal pain and vomiting.  He was wound to have pancreatic pseudocyst treated conservatively with NGT, TPN for several weeks. Patient was transferred to [facility] overnight due to concern for sepsis as well as reported enlargement of his pseudocysts.   Patient assessed at bedside this AM.  He is lying on his side under all his covers.  Follows commands and answers all questions, however seems to not fully understand why he is at [facility] or what the goals of his treatment plan are. States he does not have belly pain currently and that his abdomen is always "big, but now it's bigger." NGT in place. No TPN at present although his PICC line remains.   WBC improved today to 11.0. Afebrile.   IR consulted for pancreatic pseudocyst aspiration and drainage.   No past medical history on file.  Allergies: Patient has no known allergies.  Medications: Prior to Admission medications   Medication Sig Start Date End Date Taking? Authorizing Provider  sterile water SOLN with amino acids 10 % SOLN 1.3 g/kg, dextrose 70 % SOLN 20 % Inject into the vein continuous.    [provider]     No family history on file.  Social History   Socioeconomic History  . Marital status: Not on file    Spouse name: Not on file  . Number of children: Not on file  . Years of education: Not on file  . Highest education level: Not on file  Occupational History  . Not on file  Social Needs  . Financial resource strain: Not on file  . Food insecurity    Worry: Not on file    Inability: Not on file  . Transportation needs    Medical: Not on file    Non-medical:  Not on file  Tobacco Use  . Smoking status: Not on file  Substance and Sexual Activity  . Alcohol use: Not on file  . Drug use: Not on file  . Sexual activity: Not on file  Lifestyle  . Physical activity    Days per week: Not on file    Minutes per session: Not on file  . Stress: Not on file  Relationships  . Social Herbalist on phone: Not on file    Gets together: Not on file    Attends religious service: Not on file    Active member of club or organization: Not on file    Attends meetings of clubs or organizations: Not on file    Relationship status: Not on file  Other Topics Concern  . Not on file  Social History Narrative  . Not on file     Review of Systems: A 12 point ROS discussed and pertinent positives are indicated in the HPI above.  All other systems are negative.  Review of Systems  Constitutional: Positive for fever. Negative for fatigue.  Respiratory: Negative for cough and shortness of breath.   Cardiovascular: Negative for chest pain.  Gastrointestinal: Positive for abdominal pain, nausea and vomiting.  Genitourinary: Negative for dysuria.  Musculoskeletal: Negative for back pain.  Psychiatric/Behavioral: Negative for behavioral problems.    Vital Signs: BP 108/64   Pulse [name])  140   Temp 99.6 F (37.6 C) (Oral)   Resp (!) 32   Ht 6' (1.829 m)   Wt 189 lb 9.5 oz (86 kg)   SpO2 94%   BMI 25.71 kg/m   Physical Exam Vitals signs and nursing note reviewed.  Constitutional:      Appearance: Normal appearance.  HENT:     Mouth/Throat:     Mouth: Mucous membranes are moist.     Pharynx: Oropharynx is clear.  Cardiovascular:     Rate and Rhythm: Normal rate and regular rhythm.     Pulses: Normal pulses.  Pulmonary:     Effort: Pulmonary effort is normal. No respiratory distress.     Breath sounds: Normal breath sounds.  Abdominal:     General: There is distension.     Tenderness: There is no abdominal tenderness. There is no  guarding.  Skin:    General: Skin is warm and dry.  Neurological:     Mental Status: He is alert.  Psychiatric:        Mood and Affect: Mood normal.        Behavior: Behavior normal.        Thought Content: Thought content normal.        Judgment: Judgment normal.          Imaging: Dg Chest Port 1 View  Result Date: 04/06/2019 CLINICAL DATA:  Tachypnea. EXAM: PORTABLE CHEST 1 VIEW COMPARISON:  None. FINDINGS: Enteric tube coiled in the stomach. Right upper extremity PICC line with the tip at the cavoatrial junction. The heart size and mediastinal contours are within normal limits. Normal pulmonary vascularity. Low lung volumes. Minimal atelectasis at the peripheral left lung base. No focal consolidation, pleural effusion, or pneumothorax. No acute osseous abnormality. IMPRESSION: 1. Low lung volumes.  No active disease. Electronically Signed   By: Obie DredgeWilliam T Derry M.D.   On: 04/06/2019 18:21   Dg Abd Portable 1v  Result Date: 04/07/2019 CLINICAL DATA:  Abdominal distension. EXAM: PORTABLE ABDOMEN - 1 VIEW COMPARISON:  None. FINDINGS: An NG tube is noted in the stomach. Relative paucity of bowel gas except for moderate cecal distention. The soft tissue shadows are maintained. No obvious free air. The bony structures are intact. 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 IMPRESSION: 1. NG tube in the stomach. 2. Moderate cecal distention. Could not exclude a cecal volvulus. CT may be helpful for further evaluation. 3. Right hip AVN. Electronically Signed   By: Rudie MeyerP.  Gallerani M.D.   On: 04/07/2019 05:20    Labs:  CBC: Recent Labs    04/06/19 1840 04/07/19 0420  WBC 22.0* 11.3*  HGB 8.8* 7.1*  HCT 27.1* 22.6*  PLT 191 181    COAGS: No results for input(s): INR, APTT in the last 8760 hours.  BMP: Recent Labs    04/06/19 1830 04/07/19 0421  NA 121* 129*  K 3.1* 2.9*  CL 92* 103  CO2 15* 17*  GLUCOSE 253* 136*  BUN 24* 17  CALCIUM 7.6* 6.8*  CREATININE 1.16  0.72  GFRNONAA >60 >60  GFRAA >60 >60    LIVER FUNCTION TESTS: Recent Labs    04/06/19 1830 04/07/19 0421  BILITOT 2.2* 1.5*  AST 203* 107*  ALT 136* 90*  ALKPHOS 109 82  PROT 6.6 5.3*  ALBUMIN 1.6* 1.3*    TUMOR MARKERS: No results for input(s): AFPTM, CEA, CA199, CHROMGRNA in the last 8760 hours.  Assessment and Plan: Pancreatic pseudocysts Patient transferred  from [facility] to [facility] for concern for sepsis and enlargement of his pancreatic pseudocysts.  His vital signs are currently stable.   His WBC improved overnight.  Now on merrem and vancomycin.  IR consulted for drainage of his pseudocysts.  Case reviewed by Dr. Lowella Dandy who recommends consultation with GI for possible gastrocystostomy. Aspiration for sampling/culture if clinically indicated.  He did have blood cultures positive for Staph and is being treated for line infection.  IR available if needed.    Thank you for this interesting consult.  I greatly enjoyed meeting Khaidyn Staebell and look forward to participating in their care.  A copy of this report was sent to the requesting provider on this date.  Electronically Signed: Hoyt Koch, PA 04/07/2019, 11:30 AM   I spent a total of 40 Minutes    in face to face in clinical consultation, greater than 50% of which was counseling/coordinating care for pancreatic pseudocyst

## 2019-04-07 NOTE — Progress Notes (Signed)
NG tube was reinserted successfully . Patient tolerated well. Xray ordered to confirm placement.

## 2019-04-07 NOTE — Progress Notes (Signed)
[facility] ADULT ICU REPLACEMENT PROTOCOL FOR AM LAB REPLACEMENT ONLY  The patient does apply for the [facility] Adult ICU Electrolyte Replacment Protocol based on the criteria listed below:   1. Is GFR >/= 40 ml/min? Yes.    Patient's GFR today is >60 2. Is urine output >/= 0.5 ml/kg/hr for the last 6 hours? Yes.   Patient's UOP is .9 ml/kg/hr 3. Is BUN < 60 mg/dL? Yes.    Patient's BUN today is 17 4. Abnormal electrolyte(s): K-2.8 5. Ordered repletion with: per protocol 6. If a panic level lab has been reported, has the CCM MD in charge been notified? Yes.  .   Physician:  Dr. Harlene Ramus, Philis Nettle 04/07/2019 6:48 AM

## 2019-04-07 NOTE — Progress Notes (Signed)
Pharmacy Antibiotic Note  Charles Calhoun is a 48 y.o. male admitted on 04/06/2019 as a transfer from [facility] for higher level of care in setting of suspected sepsis from pancreatitis and enlarging pancreatic pseudocysts. Pharmacy is consulted for vancomycin for MRSA bacteremia. Blood cultures on 04/06/2019 are positive for 2/2 GPC in clusters and BCID identified MRSA. Patient has been receiving vancomycin and meropenem and now will change antibiotic regimen to vancomycin, ceftriaxone, and metronidazole. Procalcitonin 31.27. WBC 11.3. Tmax 100.1.   Patient has been receiving vancomycin 750mg  IV q8h. Scr has improved from 1.16 to 0.8. With vancomycin 750mg  IV q8h and current Scr of 0.72 rounded to 0.8, predicted AUC of 345 is subtherapeutic (goal 400-550).  Vancomycin 1500 mg IV Q 12 hrs. Goal AUC 400-550. Expected AUC: 461 SCr used: 0.8    Plan: Change vancomycin to 1500mg  IV q12h (next dose at 1500 on 10/30; 8 hours from last dose of vancomycin 750mg ) Monitor renal function, cultures/sensitivites, and clinical progression  Height: 6' (182.9 cm) Weight: 189 lb 9.5 oz (86 kg) IBW/kg (Calculated) : 77.6  Temp (24hrs), Avg:99.5 F (37.5 C), Min:99.2 F (37.3 C), Max:100 F (37.8 C)  Recent Labs  Lab 04/06/19 1830 04/06/19 1840 04/07/19 0420 04/07/19 0421  WBC  --  22.0* 11.3*  --   CREATININE 1.16  --   --  0.72    Estimated Creatinine Clearance: 121.3 mL/min (by C-G formula based on SCr of 0.72 mg/dL).    No Known Allergies  Antimicrobials this admission: Meropenem 10/29 >> 10/30 Zosyn 10/29 at OSH  Vanc 10/29 >>  CTX 10/30>> Flagyl 10/30>>   Dose adjustments this admission: 10/30: change vancomycin 750mg  IV q8h to vancomycin 1500mg  IV q12h due to improvement in renal function  Microbiology results: 10/30 Ucx:  10/30 Bcx: 2/2 GCP in clusters  10/30 BCID: MRSA  10/29 MRSA PCR positive   Thank you for allowing pharmacy to be a part of this patient's  care.  Cristela Felt, PharmD PGY1 Pharmacy Resident [employer]: [phone]   04/07/2019 11:31 AM

## 2019-04-07 NOTE — Progress Notes (Signed)
E-link RN and MD made aware about heart rate. Orders given to continue to monitor. Patient is resting comfortably. Denies any chest pain or discomfort.

## 2019-04-08 ENCOUNTER — Inpatient Hospital Stay (HOSPITAL_COMMUNITY): Payer: Non-veteran care

## 2019-04-08 ENCOUNTER — Encounter (HOSPITAL_COMMUNITY): Payer: Self-pay | Admitting: Radiology

## 2019-04-08 DIAGNOSIS — E119 Type 2 diabetes mellitus without complications: Secondary | ICD-10-CM

## 2019-04-08 DIAGNOSIS — F1010 Alcohol abuse, uncomplicated: Secondary | ICD-10-CM | POA: Diagnosis present

## 2019-04-08 DIAGNOSIS — R7881 Bacteremia: Secondary | ICD-10-CM

## 2019-04-08 DIAGNOSIS — K8522 Alcohol induced acute pancreatitis with infected necrosis: Secondary | ICD-10-CM

## 2019-04-08 DIAGNOSIS — B9562 Methicillin resistant Staphylococcus aureus infection as the cause of diseases classified elsewhere: Secondary | ICD-10-CM

## 2019-04-08 DIAGNOSIS — Z598 Other problems related to housing and economic circumstances: Secondary | ICD-10-CM

## 2019-04-08 DIAGNOSIS — K862 Cyst of pancreas: Secondary | ICD-10-CM

## 2019-04-08 DIAGNOSIS — Z9989 Dependence on other enabling machines and devices: Secondary | ICD-10-CM

## 2019-04-08 DIAGNOSIS — K7031 Alcoholic cirrhosis of liver with ascites: Secondary | ICD-10-CM

## 2019-04-08 LAB — BASIC METABOLIC PANEL
Anion gap: 8 (ref 5–15)
Anion gap: 9 (ref 5–15)
BUN: 10 mg/dL (ref 6–20)
BUN: 8 mg/dL (ref 6–20)
CO2: 21 mmol/L — ABNORMAL LOW (ref 22–32)
CO2: 22 mmol/L (ref 22–32)
Calcium: 7.5 mg/dL — ABNORMAL LOW (ref 8.9–10.3)
Calcium: 7.5 mg/dL — ABNORMAL LOW (ref 8.9–10.3)
Chloride: 99 mmol/L (ref 98–111)
Chloride: 99 mmol/L (ref 98–111)
Creatinine, Ser: 0.71 mg/dL (ref 0.61–1.24)
Creatinine, Ser: 0.66 mg/dL (ref 0.61–1.24)
GFR calc Af Amer: >60 mL/min (ref >60)
GFR calc Af Amer: >60 mL/min (ref >60)
GFR calc non Af Amer: >60 mL/min (ref >60)
GFR calc non Af Amer: >60 mL/min (ref >60)
Glucose, Bld: 109 mg/dL — ABNORMAL HIGH (ref 70–99)
Glucose, Bld: 90 mg/dL (ref 70–99)
Potassium: 4.1 mmol/L (ref 3.5–5.1)
Potassium: 3.7 mmol/L (ref 3.5–5.1)
Sodium: 128 mmol/L — ABNORMAL LOW (ref 135–145)
Sodium: 130 mmol/L — ABNORMAL LOW (ref 135–145)

## 2019-04-08 LAB — CBC
HCT: 24 % — ABNORMAL LOW (ref 39.0–52.0)
Hemoglobin: 7.5 g/dL — ABNORMAL LOW (ref 13.0–17.0)
MCH: 24 pg — ABNORMAL LOW (ref 26.0–34.0)
MCHC: 31.3 g/dL (ref 30.0–36.0)
MCV: 76.9 fL — ABNORMAL LOW (ref 80.0–100.0)
Platelets: 150 10*3/uL (ref 150–400)
RBC: 3.12 MIL/uL — ABNORMAL LOW (ref 4.22–5.81)
RDW: 19.2 % — ABNORMAL HIGH (ref 11.5–15.5)
WBC: 7.4 10*3/uL (ref 4.0–10.5)
nRBC: 0 % (ref 0.0–0.2)

## 2019-04-08 LAB — GLUCOSE, CAPILLARY
Glucose-Capillary: 103 mg/dL — ABNORMAL HIGH (ref 70–99)
Glucose-Capillary: 108 mg/dL — ABNORMAL HIGH (ref 70–99)
Glucose-Capillary: 136 mg/dL — ABNORMAL HIGH (ref 70–99)
Glucose-Capillary: 65 mg/dL — ABNORMAL LOW (ref 70–99)
Glucose-Capillary: 78 mg/dL (ref 70–99)
Glucose-Capillary: 99 mg/dL (ref 70–99)

## 2019-04-08 LAB — PHOSPHORUS: Phosphorus: 2.1 mg/dL — ABNORMAL LOW (ref 2.5–4.6)

## 2019-04-08 LAB — MAGNESIUM: Magnesium: 2 mg/dL (ref 1.7–2.4)

## 2019-04-08 MED ORDER — IOHEXOL 300 MG/ML  SOLN
100.0000 mL | Freq: Once | INTRAMUSCULAR | Status: AC | PRN
  Administered 2019-04-08: 100 mL via INTRAVENOUS

## 2019-04-08 MED ORDER — DEXTROSE 5 % IV SOLN
INTRAVENOUS | Status: DC
  Administered 2019-04-08 – 2019-04-10 (×3): via INTRAVENOUS

## 2019-04-08 MED ORDER — DEXTROSE 50 % IV SOLN
INTRAVENOUS | Status: AC
  Administered 2019-04-08: 50 mL
  Filled 2019-04-08: qty 50

## 2019-04-08 NOTE — Consult Note (Signed)
Referring Provider: No ref. provider found Primary Care Physician:  No primary care provider on file. Primary Gastroenterologist: Althia Forts   Reason for Consultation:   Pancreatic pseudocyst   HPI: Charles Calhoun is a 48 y.o. male with a past medical history of DM II, alcohol abuse and alcoholic pancreatitis.  He was diagnosed with acute pancreatitis 03/04/2019 which required TPN via right PICC line. Followed by [facility]. He presented to [facility] hospital with complaints of shortness of breath for 2 days with abdominal pain, nausea, weakness and fever.  An abdominal/pelvic CT 10/29 identified an enlarging pancreatic pseudocyst with new fluid collections, see CT results below.  His WBC was 22.  Hemoglobin 7.5.  Lactic acid 6.0.  A chest x-ray was negative.  SARS Covid 2 negative. + MRSA nasal swab. He was tachycardic and required fluid resuscitation.  He received 2 units of packed red blood cells at [facility] for acute anemia.  He was transferred to [facility] 04/06/2019 for further GI evaluation, possible cyst gastrostomy.  Interventional radiology reviewed his abdominal/pelvic CT scan and assessed that his fluid collections were not amenable to percutaneous drainage.  He is currently resting quietly in bed.  He just returned from having a repeat abdominal/pelvic CT.  The results are pending.  He continues to have left upper and left lower quadrant abdominal pain.  He has slight nausea.  No vomiting.  His NG tube has drained approximately 300 cc of brown bilious drainage.  He reported passing a small bowel movement yesterday that was brown without blood or melena.  He reported a few days ago he passed a formed black stool.  No bright red rectal bleeding.  He denies having any chest pain or palpitations.  No alcohol use for the past 2 months.  No drug use.  He remains n.p.o.  No family history of pancreatic, upper GI or colorectal cancer.  His mother had breast cancer.  Labs 04/07/2019: Sodium  129.  Potassium 2.9.  Glucose 136.  BUN 17.  Magnesium 1.  6.  Alk phos 82.  Albumin 1.3.  AST 107.  ALT 98.  Total bili 1.5.  Triglyceride 139.  WBC 11.3.  Hemoglobin 7.1.  Hematocrit 22.6.  MCV 76.6.  Platelet 181.  10/29 CXR> R PICC. No acute pulmonary abnormalities   10/29 CT a/p at [facility]: enlarging pancreatic pseudocysts (10.3 x 6.7 cm from 7.8 x 6cm) with new fluid collection along lesser curvature of stomach. Second collection near posterior main portal vein 5.6cm (previously 5.1cm) Localized mass effect on gastric antrum with fluid in stomach and esophagus-- suspicious for developing gastric outlet obstruction. Increased peripancreatic stranding concerning for acute pancreatitis. Mild ascites.   ECHO 04/07/2019: 1. Left ventricular ejection fraction, by visual estimation, is 60 to 65%. The left ventricle has normal function. There is no left ventricular hypertrophy. 2. Global right ventricle has normal systolic function.The right ventricular size is normal. No increase in right ventricular wall thickness. 3. Left atrial size was normal. 4. Right atrial size was normal. 5. The mitral valve is normal in structure. Mild mitral valve regurgitation. No evidence of mitral stenosis. 6. The tricuspid valve is normal in structure. Tricuspid valve regurgitation is trivial. 7. The aortic valve is normal in structure. Aortic valve regurgitation is not visualized. No evidence of aortic valve sclerosis or stenosis. 8. The pulmonic valve was normal in structure. Pulmonic valve regurgitation is not visualized. 9. The inferior vena cava is normal in size with greater than 50% respiratory variability, suggesting  right atrial pressure of 3 mmHg. 10. Limited quality study but no clear evidence for an endocarditis.  Past Medical History:  Diagnosis Date  . Cirrhosis (Home) 04/07/2019  . Pancreatitis 04/07/2019   No past surgical history   Prior to Admission medications   Medication Sig Start Date  End Date Taking? Authorizing Provider  acetaminophen (TYLENOL) 325 MG tablet Take 650 mg by mouth every 6 (six) hours as needed for mild pain, fever or headache.   Yes [provider]  folic acid (FOLVITE) 481 MCG tablet Take 400 mcg by mouth daily.   Yes [provider]  metoprolol succinate (TOPROL-XL) 25 MG 24 hr tablet Take 25 mg by mouth daily.   Yes [provider]  ondansetron (ZOFRAN) 4 MG tablet Take 8 mg by mouth every 8 (eight) hours as needed for nausea/vomiting.   Yes [provider]  PRESCRIPTION MEDICATION Inject 2,250 mLs into the vein every 12 (twelve) hours. amino acid 10% (AMINOSYN II) 10 % SolP, D70W SolP with sodium chloride 23.4% 4 mEq/mL SolP, fat emulsion 20 % Emul 300 mL   Yes [provider]    Current Facility-Administered Medications  Medication Dose Route Frequency Provider Last Rate Last Dose  . acetaminophen (TYLENOL) tablet 650 mg  650 mg Oral Q4H PRN Bowser, Laurel Dimmer, NP      . cefTRIAXone (ROCEPHIN) 2 g in sodium chloride 0.9 % 100 mL IVPB  2 g Intravenous Q24H Carlyle Basques, MD   Stopped at 04/07/19 1226  . Chlorhexidine Gluconate Cloth 2 % PADS 6 each  6 each Topical Q0600 Agarwala, Ravi, MD      . enoxaparin (LOVENOX) injection 40 mg  40 mg Subcutaneous Q24H Docia Barrier, PA      . HYDROmorphone (DILAUDID) injection 0.5 mg  0.5 mg Intravenous Q2H PRN Alfonzo Feller, NP   0.5 mg at 04/08/19 0947  . insulin aspart (novoLOG) injection 0-15 Units  0-15 Units Subcutaneous Q4H Alfonzo Feller, NP   2 Units at 04/07/19 1148  . lactated ringers infusion   Intravenous Continuous Julian Hy, DO 75 mL/hr at 04/08/19 1112    . metroNIDAZOLE (FLAGYL) IVPB 500 mg  500 mg Intravenous Eldred Manges, MD 100 mL/hr at 04/08/19 0500    . mupirocin ointment (BACTROBAN) 2 % 1 application  1 application Nasal BID Kipp Brood, MD   1 application at 85/63/14 0946  . ondansetron (ZOFRAN) injection 4 mg  4 mg  Intravenous Q6H PRN Cristal Generous, NP   4 mg at 04/06/19 1836  . pantoprazole (PROTONIX) injection 40 mg  40 mg Intravenous Q12H Noemi Chapel P, DO   40 mg at 04/08/19 0945  . thiamine (B-1) injection 100 mg  100 mg Intravenous Daily Noemi Chapel P, DO   100 mg at 04/08/19 0945  . vancomycin (VANCOCIN) 1,500 mg in sodium chloride 0.9 % 500 mL IVPB  1,500 mg Intravenous Q12H Henri Medal, RPH 250 mL/hr at 04/08/19 0500      Allergies as of 04/06/2019  . (No Known Allergies)    Family History  Problem Relation Age of Onset  . Liver cancer Neg Hx     Social History   Socioeconomic History  . Marital status: Single    Spouse name: Not on file  . Number of children: Not on file  . Years of education: Not on file  . Highest education level: Not on file  Occupational History  . Not on file  Social Needs  . Financial resource strain: Not on file  . Food insecurity    Worry: Not on file    Inability: Not on file  . Transportation needs    Medical: Not on file    Non-medical: Not on file  Tobacco Use  . Smoking status: Not on file  Substance and Sexual Activity  . Alcohol use: Not on file  . Drug use: Not on file  . Sexual activity: Not on file  Lifestyle  . Physical activity    Days per week: Not on file    Minutes per session: Not on file  . Stress: Not on file  Relationships  . Social Herbalist on phone: Not on file    Gets together: Not on file    Attends religious service: Not on file    Active member of club or organization: Not on file    Attends meetings of clubs or organizations: Not on file    Relationship status: Not on file  . Intimate partner violence    Fear of current or ex partner: Not on file    Emotionally abused: Not on file    Physically abused: Not on file    Forced sexual activity: Not on file  Other Topics Concern  . Not on file  Social History Narrative  . Not on file    Review of Systems: Gen: Denies fever, sweats or chills.  No weight loss.  CV: Denies chest pain, palpitations or edema. Resp: + cough, no SOB. GI: See HPI.  He is having any nausea or heartburn. GU : Denies urinary burning, blood in urine, increased urinary frequency or incontinence. MS: Denies joint pain, muscles aches or weakness. Derm: Denies rash, itchiness, skin lesions or unhealing ulcers. Psych: Denies depression, anxiety, memory loss, suicidal ideation and confusion. Heme: Denies bruising, bleeding. Neuro:  Denies headaches, dizziness or paresthesias. Endo:  Denies any problems with DM, thyroid or adrenal function.  Physical Exam: Vital signs in last 24 hours: Temp:  [100.8 F (38.2 C)] 100.8 F (38.2 C) (10/30 1522) Pulse Rate:  [129-147] 131 (10/31 1100) Resp:  [21-30] 26 (10/31 1100) BP: (98-142)/(61-89) 109/75 (10/31 1100) SpO2:  [93 %-100 %] 97 % (10/31 1100) Weight:  [86.1 kg] 86.1 kg (10/31 0500) Last BM Date: 04/06/19 General:   Alert in no acute distress, resting in bed with the sheets over his head. Head:  Normocephalic and atraumatic. Eyes:  Sclera clear, no icterus. Conjunctiva pink. Ears:  Normal auditory acuity. Nose:  No deformity, discharge or lesions. Mouth:  No deformity or lesions.  Poor dentition. Neck:  Supple. Lungs: Scattered expiratory wheezes throughout. Heart: Tachycardic, no murmurs. Abdomen: Abdomen is distended but not tense, hypoactive bowel sounds auscultated when the NG tube was clamped, moderate left upper quadrant and left lower quadrant tenderness without rebound or guarding, no HSM.  Umbilical hernia. Rectal: Patient declined rectal exam. Msk:  Symmetrical without gross deformities. . Pulses:  Normal pulses noted. Extremities:  Without clubbing or edema. Neurologic:  Alert and  oriented x4;  grossly normal neurologically. Skin:  Intact without significant lesions or rashes.  Skin  is dry and flaky. Psych:  Alert and cooperative. Normal mood and affect.  Intake/Output from previous day:  10/30 0701 - 10/31 0700 In: 5300.5 [I.V.:3011.8; IV Piggyback:2288.7] Out: 2175 [Urine:1575; Emesis/NG output:600] Intake/Output this shift: Total I/O In: -  Out: 300 [Emesis/NG output:300]  Lab Results: Recent Labs    04/06/19 1840 04/07/19 0420 04/07/19  1329  WBC 22.0* 11.3* 10.4  HGB 8.8* 7.1* 7.6*  HCT 27.1* 22.6* 23.8*  PLT 191 181 175   BMET Recent Labs    04/07/19 1329 04/07/19 1945 04/08/19 0111  NA 129* 129* 128*  K 4.2 4.3 4.1  CL 100 100 99  CO2 23 21* 21*  GLUCOSE 129* 120* 109*  BUN '14 12 10  ' CREATININE 0.76 0.78 0.71  CALCIUM 7.7* 7.5* 7.5*   LFT Recent Labs    04/07/19 0421  PROT 5.3*  ALBUMIN 1.3*  AST 107*  ALT 90*  ALKPHOS 82  BILITOT 1.5*   PT/INR No results for input(s): LABPROT, INR in the last 72 hours. Hepatitis Panel No results for input(s): HEPBSAG, HCVAB, HEPAIGM, HEPBIGM in the last 72 hours.  Studies/Results: Dg Chest Port 1 View  Result Date: 04/06/2019 CLINICAL DATA:  Tachypnea. EXAM: PORTABLE CHEST 1 VIEW COMPARISON:  None. FINDINGS: Enteric tube coiled in the stomach. Right upper extremity PICC line with the tip at the cavoatrial junction. The heart size and mediastinal contours are within normal limits. Normal pulmonary vascularity. Low lung volumes. Minimal atelectasis at the peripheral left lung base. No focal consolidation, pleural effusion, or pneumothorax. No acute osseous abnormality. IMPRESSION: 1. Low lung volumes.  No active disease. Electronically Signed   By: Titus Dubin M.D.   On: 04/06/2019 18:21   Dg Abd Portable 1v  Result Date: 04/08/2019 CLINICAL DATA:  NG tube placement EXAM: PORTABLE ABDOMEN - 1 VIEW COMPARISON:  04/07/2019 FINDINGS: The enteric tube projects over the gastric body. The tip is pointed distally. The bowel gas pattern is similar to prior study with distention of the cecum. No definite pneumatosis or free air identified on this exam. IMPRESSION: Enteric tube projects over the gastric body.  Electronically Signed   By: Constance Holster M.D.   On: 04/08/2019 00:04   Dg Abd Portable 1v  Result Date: 04/07/2019 CLINICAL DATA:  Abdominal distension. EXAM: PORTABLE ABDOMEN - 1 VIEW COMPARISON:  None. FINDINGS: An NG tube is noted in the stomach. Relative paucity of bowel gas except for moderate cecal distention. The soft tissue shadows are maintained. No obvious free air. The bony structures are intact. 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 IMPRESSION: 1. NG tube in the stomach. 2. Moderate cecal distention. Could not exclude a cecal volvulus. CT may be helpful for further evaluation. 3. Right hip AVN. Electronically Signed   By: Marijo Sanes M.D.   On: 04/07/2019 05:20    IMPRESSION/PLAN:  96.  48 year old male with a history of alcohol abuse with recurrent alcoholic pancreatitis with pseudocyst.  An abdominal/pelvic CT 10/29 at [facility] identified enlarging pancreatic pseudocyst with new fluid collection with mass-effect concerning for developing gastric outlet obstruction.  He was transferred to [facility] for consideration of a cyst gastrostomy. Today WBC 7.4. He is afebrile. Blood cultures pending.  -Abdominal/pelvic CT with contrast was repeated this afternoon, the results are pending.  Further recommendations to be determined after CT results reviewed by Dr. Rush Landmark. -Continue Ceftriaxone 2 g IV every 24 hours, Flagyl 500 mg IV every 8 hours and Vancomycin 1500 mg IV every 12 hours -Continue Dilaudid 0.5 mg IV every 2 hours as needed for pain -LR at 75 cc an hour -Protonix 40 mg IV twice daily -Zofran as needed  2. Acute Anemia.  He received 2 units of packed red blood cells at [facility].  Hemoglobin 7.5.  He reported having a black soft stool a few  days ago.  He declined a rectal exam.   -FOBT -Monitor NG tube drainage for bleeding -Monitor for melenic bowel movements or rectal bleeding -Repeat CBC in a.m.  3.  History of alcohol abuse.  Alcohol for 2  months.  4. Tachycardia secondary to pancreatitis  5. + MRSA nasal swab on isolation precautions      Noralyn Pick  04/08/2019, 11:56 AM

## 2019-04-08 NOTE — Progress Notes (Signed)
Patient ID: Charles Calhoun, male   DOB: Jun 19, 1970, 48 y.o.   MRN: [id number]         [facility]  Date of Admission:  04/06/2019           Day 3 vancomycin        Day 2 ceftriaxone        Day 2 metronidazole ASSESSMENT: I strongly suspect that his acute illness was due to MRSA bacteremia related to his PICC line.  Think bacteremia secondary to infected pseudocyst is much less likely.  Repeat blood cultures have been ordered.  There is no evidence of endocarditis by exam or TTE but we will need to consider TEE early next week.  He needs to continue vancomycin.  We will continue empiric ceftriaxone and metronidazole pending GI's evaluation.  PLAN: 1. Continue current antibiotics for now 2. Repeat blood cultures 3. Replace PICC until blood cultures are negative for at least 48 hours  Principal Problem:   MRSA bacteremia Active Problems:   Sepsis (HCC)   Pancreatitis   Pancreatic pseudocyst   Cirrhosis (HCC)   Diabetes mellitus (HCC)   Alcohol abuse   Scheduled Meds:  Chlorhexidine Gluconate Cloth  6 each Topical Q0600   enoxaparin (LOVENOX) injection  40 mg Subcutaneous Q24H   insulin aspart  0-15 Units Subcutaneous Q4H   mupirocin ointment  1 application Nasal BID   pantoprazole (PROTONIX) IV  40 mg Intravenous Q12H   thiamine injection  100 mg Intravenous Daily   Continuous Infusions:  cefTRIAXone (ROCEPHIN)  IV 2 g (04/08/19 1327)   lactated ringers 75 mL/hr at 04/08/19 1112   metronidazole 500 mg (04/08/19 1328)   vancomycin 1,500 mg (04/08/19 1514)   PRN Meds:.acetaminophen, HYDROmorphone (DILAUDID) injection, ondansetron (ZOFRAN) IV   SUBJECTIVE: He is feeling much better.  He is hungry.  He has had waxing and waning abdominal pain since being diagnosed with pancreatitis a little over a month ago.  He came to the hospital 2 days ago because he felt lightheaded and thought he was going to pass out.  He was febrile and admission  blood cultures grew MRSA.  He has been receiving TPN at home via a right arm PICC.  His PICC was removed yesterday.  Review of Systems: Review of Systems  Constitutional: Negative for fever.  Respiratory: Negative for cough and shortness of breath.   Cardiovascular: Negative for chest pain.  Gastrointestinal: Positive for abdominal pain. Negative for nausea and vomiting.  Genitourinary: Negative for dysuria.    No Known Allergies  OBJECTIVE: Vitals:   04/08/19 1335 04/08/19 1400 04/08/19 1500 04/08/19 1517  BP: (!) 143/97 121/78 107/75   Pulse: (!) 135 (!) 130 (!) 140   Resp: (!) 27 (!) 26 (!) 24   Temp:    100.2 F (37.9 C)  TempSrc:    Oral  SpO2: 100% 98% 98%   Weight:      Height:       Body mass index is 25.74 kg/m.  Physical Exam Constitutional:      Comments: He is alert and comfortable sitting up in bed watching television.  Cardiovascular:     Rate and Rhythm: Normal rate and regular rhythm.     Heart sounds: No murmur.  Pulmonary:     Effort: Pulmonary effort is normal.     Breath sounds: Normal breath sounds.  Abdominal:     General: There is distension.     Palpations: Abdomen is soft.  Tenderness: There is abdominal tenderness.     Comments: Quiet bowel sounds.  Skin:    Findings: No rash.     Comments: Previous right arm PIC site looks good.  Psychiatric:        Mood and Affect: Mood normal.     Lab Results Lab Results  Component Value Date   WBC 7.4 04/08/2019   HGB 7.5 (L) 04/08/2019   HCT 24.0 (L) 04/08/2019   MCV 76.9 (L) 04/08/2019   PLT 150 04/08/2019    Lab Results  Component Value Date   CREATININE 0.66 04/08/2019   BUN 8 04/08/2019   NA 130 (L) 04/08/2019   K 3.7 04/08/2019   CL 99 04/08/2019   CO2 22 04/08/2019    Lab Results  Component Value Date   ALT 90 (H) 04/07/2019   AST 107 (H) 04/07/2019   ALKPHOS 82 04/07/2019   BILITOT 1.5 (H) 04/07/2019     Microbiology: Recent Results (from the past 240 hour(s))    Culture, blood (routine x 2)     Status: Abnormal (Preliminary result)   Collection Time: 04/06/19  5:25 PM   Specimen: Site Not Specified; Blood  Result Value Ref Range Status   Specimen Description SITE NOT SPECIFIED  Final   Special Requests AEROBIC BOTTLE ONLY Blood Culture adequate volume  Final   Culture  Setup Time   Final    GRAM POSITIVE COCCI IN CLUSTERS AEROBIC BOTTLE ONLY CRITICAL VALUE NOTED.  VALUE IS CONSISTENT WITH PREVIOUSLY REPORTED AND CALLED VALUE. Performed at [facility], [place] [address] [place], [place] [address]    Culture STAPHYLOCOCCUS AUREUS (A)  Final   Report Status PENDING  Incomplete  Culture, blood (routine x 2)     Status: Abnormal (Preliminary result)   Collection Time: 04/06/19  5:25 PM   Specimen: Site Not Specified; Blood  Result Value Ref Range Status   Specimen Description SITE NOT SPECIFIED  Final   Special Requests AEROBIC BOTTLE ONLY Blood Culture adequate volume  Final   Culture  Setup Time   Final    GRAM POSITIVE COCCI IN CLUSTERS AEROBIC BOTTLE ONLY Organism ID to follow CRITICAL RESULT CALLED TO, READ BACK BY AND VERIFIED WITH: Sharen Heck PharmD 9:15 04/07/19 ([email or web address]) Performed at [facility], [address], [place],  [address]    Culture STAPHYLOCOCCUS AUREUS (A)  Final   Report Status PENDING  Incomplete  Blood Culture ID Panel (Reflexed)     Status: Abnormal   Collection Time: 04/06/19  5:25 PM  Result Value Ref Range Status   Enterococcus species NOT DETECTED NOT DETECTED Final   Listeria monocytogenes NOT DETECTED NOT DETECTED Final   Staphylococcus species DETECTED (A) NOT DETECTED Final    Comment: CRITICAL RESULT CALLED TO, READ BACK BY AND VERIFIED WITH: Sharen Heck PharmD 9:15 04/07/19 ([email or web address])    Staphylococcus aureus (BCID) DETECTED (A) NOT DETECTED Final    Comment: Methicillin (oxacillin)-resistant Staphylococcus aureus (MRSA). MRSA is predictably resistant to beta-lactam antibiotics  (except ceftaroline). Preferred therapy is vancomycin unless clinically contraindicated. Patient requires contact precautions if  hospitalized. CRITICAL RESULT CALLED TO, READ BACK BY AND VERIFIED WITH: Sharen Heck PharmD 9:15 04/07/19 ([email or web address])    Methicillin resistance DETECTED (A) NOT DETECTED Final    Comment: CRITICAL RESULT CALLED TO, READ BACK BY AND VERIFIED WITH: Sharen Heck PharmD 9:15 04/07/19 ([email or web address])    Streptococcus species NOT DETECTED NOT DETECTED Final   Streptococcus agalactiae NOT DETECTED NOT DETECTED Final  Streptococcus pneumoniae NOT DETECTED NOT DETECTED Final   Streptococcus pyogenes NOT DETECTED NOT DETECTED Final   Acinetobacter baumannii NOT DETECTED NOT DETECTED Final   Enterobacteriaceae species NOT DETECTED NOT DETECTED Final   Enterobacter cloacae complex NOT DETECTED NOT DETECTED Final   Escherichia coli NOT DETECTED NOT DETECTED Final   Klebsiella oxytoca NOT DETECTED NOT DETECTED Final   Klebsiella pneumoniae NOT DETECTED NOT DETECTED Final   Proteus species NOT DETECTED NOT DETECTED Final   Serratia marcescens NOT DETECTED NOT DETECTED Final   Haemophilus influenzae NOT DETECTED NOT DETECTED Final   Neisseria meningitidis NOT DETECTED NOT DETECTED Final   Pseudomonas aeruginosa NOT DETECTED NOT DETECTED Final   Candida albicans NOT DETECTED NOT DETECTED Final   Candida glabrata NOT DETECTED NOT DETECTED Final   Candida krusei NOT DETECTED NOT DETECTED Final   Candida parapsilosis NOT DETECTED NOT DETECTED Final   Candida tropicalis NOT DETECTED NOT DETECTED Final    Comment: Performed at [facility], [address], [place], [place] [address]  MRSA PCR Screening     Status: Abnormal   Collection Time: 04/06/19  7:05 PM   Specimen: Nasal Mucosa; Nasopharyngeal  Result Value Ref Range Status   MRSA by PCR POSITIVE (A) NEGATIVE Final    Comment:        The GeneXpert MRSA Assay (FDA approved for NASAL specimens only), is one component  of a comprehensive MRSA colonization surveillance program. It is not intended to diagnose MRSA infection nor to guide or monitor treatment for MRSA infections. RESULT CALLED TO, READ BACK BY AND VERIFIED WITH: Milford Cage RN 04/06/19 2102 JDW Performed at [facility], [address], [place], [place] [address]   Urine culture     Status: None   Collection Time: 04/07/19  4:21 AM   Specimen: Urine, Random  Result Value Ref Range Status   Specimen Description URINE, RANDOM  Final   Special Requests NONE  Final   Culture   Final    NO GROWTH Performed at [facility], [address] [place], [place] [address]    Report Status 04/07/2019 FINAL  Final  Culture, blood (routine x 2)     Status: None (Preliminary result)   Collection Time: 04/08/19  1:19 AM   Specimen: BLOOD  Result Value Ref Range Status   Specimen Description BLOOD RIGHT ANTECUBITAL  Final   Special Requests   Final    BOTTLES DRAWN AEROBIC AND ANAEROBIC Blood Culture results may not be optimal due to an excessive volume of blood received in culture bottles   Culture   Final    NO GROWTH < 12 HOURS Performed at [facility], [address] [place], [place] [address]    Report Status PENDING  Incomplete  Culture, blood (routine x 2)     Status: None (Preliminary result)   Collection Time: 04/08/19  1:19 AM   Specimen: BLOOD  Result Value Ref Range Status   Specimen Description BLOOD RIGHT ANTECUBITAL  Final   Special Requests   Final    BOTTLES DRAWN AEROBIC AND ANAEROBIC Blood Culture adequate volume   Culture   Final    NO GROWTH < 12 HOURS Performed at [facility], [address], [place], [place] [address]    Report Status PENDING  Incomplete    Cliffton Asters, MD [facility] [phone] pager   [phone] cell 04/08/2019, 4:00  PM

## 2019-04-08 NOTE — Progress Notes (Signed)
NAME:  Charles Calhoun, MRN:  [id number], DOB:  11/27/1970, LOS: 2 ADMISSION DATE:  04/06/2019, CONSULTATION DATE:  04/06/19 REFERRING MD:  Barnabas Harries, CHIEF COMPLAINT:  Abdominal pain  Brief History   48 yo M transferred from [facility] to [facility] MICU for higher level of care in setting of suspected sepsis, enlarging pancreatic pseudocysts.   History of present illness   48 yo M PMH EtOH abuse, Pancreatitis, who presented to [facility] with CC SOB. SOB began 2 days prior to presentation, onset gradually, and with associated symptoms of anorexia, abdominal pain, nausea, weakness and fever. Pain is dull in quality and moderate. No alleviating factors. Patient denies chest pain, vomiting, sick contacts, diarrhea, constipation.  CT abdomen concerning for acute pancreatitis and interval increase in size of pancreatic pseudocysts. Of note, patient with acute pancreatitis 9/26 for which he is receiving TPN via R PICC.  Glu 597, K 5.3, Ca 6.9, Sodium 112 AST 419 ALT 178 Alk phos 146 TBili 2.4  WBC 22,  Hgb  7.5 Lactic acid 6.0  PCT 15.85, BNP 142  Received vanc, zosyn at [facility]. Evaluated by surgeon who determined patient required higher level of care for medical management. Received 2 PRBC for acute anemia. Patient transferred to [facility] MICU.   Past Medical History  EtOH abuse- last drink 2 months ago Pancreatitis Pancreatic pseudocyst  DM with DKA   Significant Hospital Events   10/29 transferred from [facility] to [facility] MICU.  Consults:  IR GI  Procedures:  PICC removed 10/30  Significant Diagnostic Tests:   10/29 CXR> R PICC. No acute pulmonary abnormalities   10/29 CT a/p at [facility] enlarging pancreatic pseudocysts (10.3 x 6.7 cm from 7.8 x 6cm) with new fluid collection along lesser curvature of stomach. Second collection near posterior main portal vein 5.6cm (previously 5.1cm) Localized mass effect on gastric antrum with fluid in stomach and esophagus-- suspicious for developing gastric  outlet obstruction. Increased peripancreatic stranding concerning for acute pancreatitis. Mild ascites.   Micro Data:  10/29 SARS Cov2> negative (performed at [facility])   Antimicrobials:  10/29 Vanc >> 10/29 Meropenem x 1 dose  Ceftriaxone 10/30>> Metronidazole 10/30 >>  Interim history/subjective:  Pain is well controlled with medications.  No nausea.  No new complaints.  Objective   Blood pressure 110/75, pulse (!) 139, temperature (!) 100.8 F (38.2 C), temperature source Oral, resp. rate (!) 26, height 6' (1.829 m), weight 86.1 kg, SpO2 97 %.        Intake/Output Summary (Last 24 hours) at 04/08/2019 0806 Last data filed at 04/08/2019 0500 Gross per 24 hour  Intake 4371.07 ml  Output 1900 ml  Net 2471.07 ml   Filed Weights   04/06/19 1500 04/07/19 0500 04/08/19 0500  Weight: 85.9 kg 86 kg 86.1 kg    Examination: General: Chronically ill-appearing man lying comfortably in bed HENT: [place]/AT, eyes anicteric neck: No JVD CV:  Tachycardic, regular rhythm Lungs: Mild tachypnea, breathing comfortably on room air CTA B ABD:  Protuberant abdomen, mildly tender to palpation,tympanitic on percussion. EXT: PICC removed, no erythema around site.  No edema. 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 Has been on TPN out pt since September 2020 /p d/c from [facility] for treatment of  same.   Sepsis due to likely infected pancreatic pseudocyst vs Staph aureus bacteremia.  PICC removed 10/30.  No obvious vegetation on transthoracic echocardiogram. -Consult GI -CT with contrast abdomen and pelvis ordered per GIs recommendations -Continue broad-spectrum antibiotics per IDs  recommendations -Repeat blood cultures pending  Ileus -Continue NG tube to low intermittent wall suction -Continue to monitor output  Hyperglycemia-well-controlled -Continue Accu-Cheks every 4 hours with sliding scale insulin as needed -Goal BG 140-180 while admitted if requiring insulin  Hx EtOH abuse; last drink 2 months ago P -IV thiamine -We will defer to GI recommendations for resuming TPN  Hyponatremia- stable -Serial BMPs -Decrease LR to 75 cc/h  Hypokalemia -Continue to monitor  Non-anion gap metabolic acidosis, possibly due to hyperalimentation -Continue to monitor  Acute on chronic anemia; stable -Serial CBCs -Type and screen previously submission -Transfuse for hemoglobin less than 7 or hemodynamically significant bleeding -Holding medical DVT prophylaxis due to concern for bleeding  Sinus tachycardia due to critical illness -Continue to monitor   Best practice:  Diet: TPN per pharmacy- on hold during line holiday Pain/Anxiety/Delirium protocol (if indicated): PRN dialudid VAP protocol (if indicated): na DVT prophylaxis: SCD; due to hemoglobin drop chemical DVT prophylaxis is contraindicated currently GI prophylaxis: PPI Glucose control: SSI  Mobility: Up to chair Code Status: Full  Family Communication: patient updated at bedside  Disposition: ICU  Labs   CBC: Recent Labs  Lab 04/06/19 1840 04/07/19 0420 04/07/19 1329  WBC 22.0* 11.3* 10.4  NEUTROABS 20.7* 9.2*  --   HGB 8.8* 7.1* 7.6*  HCT 27.1* 22.6* 23.8*  MCV 76.1* 76.6* 76.5*  PLT 191 181 952    Basic Metabolic Panel: Recent Labs  Lab 04/06/19 1830 04/07/19 0421 04/07/19 1329 04/07/19 1945 04/08/19 0111  NA 121* 129* 129* 129* 128*  K 3.1* 2.9* 4.2 4.3 4.1  CL 92* 103 100 100 99  CO2 15* 17* 23 21* 21*  GLUCOSE 253* 136* 129* 120* 109*  BUN 24* '17 14 12 10  ' CREATININE 1.16 0.72 0.76 0.78 0.71  CALCIUM 7.6* 6.8* 7.7* 7.5* 7.5*  MG 1.6* 1.6*  --   --  2.0  PHOS 3.1 1.7*   --   --   --    GFR: Estimated Creatinine Clearance: 121.3 mL/min (by C-G formula based on SCr of 0.71 mg/dL). Recent Labs  Lab 04/06/19 1830 04/06/19 1840 04/07/19 0420 04/07/19 1329  PROCALCITON 31.27  --   --   --   WBC  --  22.0* 11.3* 10.4    Liver Function Tests: Recent Labs  Lab 04/06/19 1830 04/07/19 0421  AST 203* 107*  ALT 136* 90*  ALKPHOS 109 82  BILITOT 2.2* 1.5*  PROT 6.6 5.3*  ALBUMIN 1.6* 1.3*   Recent Labs  Lab 04/06/19 1830  LIPASE 52*  AMYLASE 180*   No results for input(s): AMMONIA in the last 168 hours.  ABG No results found for: PHART, PCO2ART, PO2ART, HCO3, TCO2, ACIDBASEDEF, O2SAT   Coagulation Profile: No results for input(s): INR, PROTIME in the last 168 hours.  Cardiac Enzymes: No results for input(s): CKTOTAL, CKMB, CKMBINDEX, TROPONINI in the last 168 hours.  HbA1C: Hgb A1c MFr Bld  Date/Time Value Ref Range Status  04/06/2019 06:40 PM 5.8 (H) 4.8 - 5.6 % Final    Comment:    (NOTE) Pre diabetes:          5.7%-6.4% Diabetes:              >6.4% Glycemic control  for   <7.0% adults with diabetes     CBG: Recent Labs  Lab 04/07/19 1521 04/07/19 1943 04/08/19 0045 04/08/19 0358 04/08/19 0745  GLUCAP 119* 116* 108* 99 103*     This patient is critically ill with multiple organ system failure which requires frequent high complexity decision making, assessment, support, evaluation, and titration of therapies. This was completed through the application of advanced monitoring technologies and extensive interpretation of multiple databases. During this encounter critical care time was devoted to patient care services described in this note for 40 minutes.  Julian Hy, DO 04/08/19 10:49 AM [facility]

## 2019-04-08 NOTE — Progress Notes (Signed)
Hypoglycemic Event  CBG: 65  Treatment: an amp of D50   Symptoms: None   Follow-up CBG: 136  Time: 2035 CBG Result:   Possible Reasons for Event: NPO   Comments/MD notified: Yes. Orders given     Antonietta Breach

## 2019-04-08 NOTE — Progress Notes (Signed)
PHARMACY - ADULT TOTAL PARENTERAL NUTRITION CONSULT NOTE   Pharmacy Consult for TPN Indication: pancreatitis  Patient Measurements: Height: 6' (182.9 cm) Weight: 189 lb 13.1 oz (86.1 kg) IBW/kg (Calculated) : 77.6 TPN AdjBW (KG): 85.9 Body mass index is 25.74 kg/m.  Assessment:  48 year old male presented with abdominal pain and vomiting. He has a history of alcohol abuse and cirrhosis. He complained of abdominal pain despite being NPO. Found to have pancreatic pseudocyst, decision was made to treat patient conservatively with NPO status and TPN for 6 weeks. TPN was initiated on 03/04/19 with potential end date of 04/15/19. Patient's care has been at [facility] in [place], [place]. Patient was transferred to [facility] on 10/29 after becoming septic and finding that the pseudocysts have become enlarged.  GI: On home TPN through [facility] in [place] Endo: cbgs 140-235 Insulin requirements in the past 24 hours: 11 units ssi Lytes:  Renal: Pulm: On RA Cards:  Hepatobil: AST/ALT 203/136 > 107/90, Tbili 1.5 Neuro: A&O ID: MRSA bacteremia on vancomycin, flagyl, ceftriaxone  TPN Access: PICC to be pulled for bacteremia, will need new PICC prior to restarting TPN TPN start date:  Nutritional Goals (per RD recommendation on 10/30): KCal: 2500-2700 Protein: 125-135 Fluid: >2.4L  Current Nutrition:  NPO  Plan:  -PICC removed yesterday, line holiday x 48h.  Will f/u with ID recs and new PICC for TPN start -Awaiting TPN prescription to be faxed from [place]   Bertis Ruddy, PharmD Clinical Pharmacist Please check AMION for all [place] numbers 04/08/2019 7:27 AM

## 2019-04-09 DIAGNOSIS — K7030 Alcoholic cirrhosis of liver without ascites: Secondary | ICD-10-CM

## 2019-04-09 DIAGNOSIS — D649 Anemia, unspecified: Secondary | ICD-10-CM

## 2019-04-09 DIAGNOSIS — K8521 Alcohol induced acute pancreatitis with uninfected necrosis: Secondary | ICD-10-CM

## 2019-04-09 DIAGNOSIS — R000 Tachycardia, unspecified: Secondary | ICD-10-CM

## 2019-04-09 DIAGNOSIS — K567 Ileus, unspecified: Secondary | ICD-10-CM

## 2019-04-09 LAB — GLUCOSE, CAPILLARY
Glucose-Capillary: 103 mg/dL — ABNORMAL HIGH (ref 70–99)
Glucose-Capillary: 107 mg/dL — ABNORMAL HIGH (ref 70–99)
Glucose-Capillary: 108 mg/dL — ABNORMAL HIGH (ref 70–99)
Glucose-Capillary: 92 mg/dL (ref 70–99)
Glucose-Capillary: 96 mg/dL (ref 70–99)
Glucose-Capillary: 98 mg/dL (ref 70–99)

## 2019-04-09 LAB — CULTURE, BLOOD (ROUTINE X 2)
Special Requests: ADEQUATE
Special Requests: ADEQUATE

## 2019-04-09 LAB — COMPREHENSIVE METABOLIC PANEL
ALT: 53 U/L — ABNORMAL HIGH (ref 0–44)
AST: 53 U/L — ABNORMAL HIGH (ref 15–41)
Albumin: 1.3 g/dL — ABNORMAL LOW (ref 3.5–5.0)
Alkaline Phosphatase: 81 U/L (ref 38–126)
Anion gap: 11 (ref 5–15)
BUN: 5 mg/dL — ABNORMAL LOW (ref 6–20)
CO2: 19 mmol/L — ABNORMAL LOW (ref 22–32)
Calcium: 7.3 mg/dL — ABNORMAL LOW (ref 8.9–10.3)
Chloride: 101 mmol/L (ref 98–111)
Creatinine, Ser: 0.65 mg/dL (ref 0.61–1.24)
GFR calc Af Amer: >60 mL/min (ref >60)
GFR calc non Af Amer: >60 mL/min (ref >60)
Glucose, Bld: 115 mg/dL — ABNORMAL HIGH (ref 70–99)
Potassium: 3.2 mmol/L — ABNORMAL LOW (ref 3.5–5.1)
Sodium: 131 mmol/L — ABNORMAL LOW (ref 135–145)
Total Bilirubin: 1.1 mg/dL (ref 0.3–1.2)
Total Protein: 5.6 g/dL — ABNORMAL LOW (ref 6.5–8.1)

## 2019-04-09 LAB — SEDIMENTATION RATE: Sed Rate: 93 mm/hr — ABNORMAL HIGH (ref 0–16)

## 2019-04-09 LAB — C-REACTIVE PROTEIN: CRP: 13.3 mg/dL — ABNORMAL HIGH (ref <1.0)

## 2019-04-09 MED ORDER — MAGNESIUM SULFATE 2 GM/50ML IV SOLN
2.0000 g | Freq: Once | INTRAVENOUS | Status: AC
  Administered 2019-04-09: 09:00:00 2 g via INTRAVENOUS
  Filled 2019-04-09: qty 50

## 2019-04-09 MED ORDER — HYDROMORPHONE HCL 1 MG/ML IJ SOLN
1.0000 mg | INTRAMUSCULAR | Status: DC | PRN
  Administered 2019-04-09 – 2019-04-23 (×104): 1 mg via INTRAVENOUS
  Filled 2019-04-09 (×108): qty 1

## 2019-04-09 MED ORDER — POTASSIUM CHLORIDE 10 MEQ/100ML IV SOLN
10.0000 meq | INTRAVENOUS | Status: AC
  Administered 2019-04-09 (×4): 10 meq via INTRAVENOUS
  Filled 2019-04-09 (×4): qty 100

## 2019-04-09 NOTE — Progress Notes (Signed)
NAME:  Charles Calhoun, MRN:  [id number], DOB:  03-07-1971, LOS: 3 ADMISSION DATE:  04/06/2019, CONSULTATION DATE:  04/06/19 REFERRING MD:  Barnabas Harries, CHIEF COMPLAINT:  Abdominal pain  Brief History   48 yo M transferred from [facility] to [facility] MICU for higher level of care in setting of suspected sepsis, enlarging pancreatic pseudocysts.   History of present illness   48 yo M PMH EtOH abuse, Pancreatitis, who presented to [facility] with CC SOB. SOB began 2 days prior to presentation, onset gradually, and with associated symptoms of anorexia, abdominal pain, nausea, weakness and fever. Pain is dull in quality and moderate. No alleviating factors. Patient denies chest pain, vomiting, sick contacts, diarrhea, constipation.  CT abdomen concerning for acute pancreatitis and interval increase in size of pancreatic pseudocysts. Of note, patient with acute pancreatitis 9/26 for which he is receiving TPN via R PICC.  Glu 597, K 5.3, Ca 6.9, Sodium 112 AST 419 ALT 178 Alk phos 146 TBili 2.4  WBC 22,  Hgb  7.5 Lactic acid 6.0  PCT 15.85, BNP 142  Received vanc, zosyn at [facility]. Evaluated by surgeon who determined patient required higher level of care for medical management. Received 2 PRBC for acute anemia. Patient transferred to [facility] MICU.   Past Medical History  EtOH abuse- last drink 2 months ago Pancreatitis Pancreatic pseudocyst  DM with DKA   Significant Hospital Events   10/29 transferred from [facility] to [facility] MICU.  Consults:  IR GI ID  Procedures:  PICC removed 10/30  Significant Diagnostic Tests:   10/29 CXR> R PICC. No acute pulmonary abnormalities   10/29 CT a/p at [facility] enlarging pancreatic pseudocysts (10.3 x 6.7 cm from 7.8 x 6cm) with new fluid collection along lesser curvature of stomach. Second collection near posterior main portal vein 5.6cm (previously 5.1cm) Localized mass effect on gastric antrum with fluid in stomach and esophagus-- suspicious for developing  gastric outlet obstruction. Increased peripancreatic stranding concerning for acute pancreatitis. Mild ascites.   Micro Data:  10/29 SARS Cov2> negative (performed at [facility])  04/06/2019 blood cultures-MRSA 10/31 blood cultures >>  Antimicrobials:  10/29 Vanc 10/29 Meropenem x 1 dose  Ceftriaxone 10/30>>  Interim history/subjective:  Complains of abdominal pain that is reasonably well controlled with as needed Dilaudid.  No nausea.  Worst pain on the left side compared to the right.  Objective   Blood pressure 107/66, pulse (!) 126, temperature 99.5 F (37.5 C), temperature source Oral, resp. rate (!) 25, height 6' (1.829 m), weight 86.2 kg, SpO2 97 %.        Intake/Output Summary (Last 24 hours) at 04/09/2019 1219 Last data filed at 04/09/2019 0900 Gross per 24 hour  Intake 2844.47 ml  Output 1075 ml  Net 1769.47 ml   I/O + 5L since admission Filed Weights   04/07/19 0500 04/08/19 0500 04/09/19 0500  Weight: 86 kg 86.1 kg 86.2 kg    Examination: General: chronically- ill appearing man laying in bed in NAD, stable compared to previous exams HENT: [place]/AT, eyes anicteric  neck: no JVD CV:  less tachycardic rate in the 120s, regular rhythm, no murmurs.  Sinus rhythm on telemetry Lungs: Wheezing comfortably on room air, minimal tachypnea.  Clear to auscultation bilaterally ABD:  Protuberant abdomen, mildly distended and tympanic to percussion.  Tender to palpation on the left more than the right. EXT: No clubbing, cyanosis, edema Skin: No rashes or wounds Neuro: Alert, answering questions appropriately with normal speech.  Moving all extremities  spontaneously. 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 Has been on TPN out pt since September 2020 s/p d/c from the hospital in [place] for  treatment of same.   Sepsis due to likely infected pancreatic pseudocyst vs Staph aureus bacteremia.  Although his PICC did not appear infected, this is likely the source of bacteremia.  Transthoracic echocardiogram without evidence of endocarditis. -Continue Vanco, ceftriaxone, metronidazole -Continue to follow cultures -Appreciate ID's assistance -Appreciate GIs assistance.  Unfortunately due to his insurance issues and the nature of the procedures he would need with required follow-up procedures, they are unable to perform these.  I have contacted [facility] and he is on the waiting list for transfer to a progressive care unit bed.  They will call our on-call pager when a bed is available to obtain a formal signout (preliminary information given).  Ileus -Continue n.p.o. and NG tube to low intermittent wall suction -Limit opiates as possible  Mild hypoglycemia -Continue dextrose and IV fluids -Continue Accu-Cheks every 4 hours  -Goal BG 140-180 while admitted if requiring insulin  Hx EtOH abuse; last drink 2 months ago -Off TPN since central access removed -IV thiamine -Ideally would like to get him off of TPN when his ileus resolves  Hyponatremia-slowly improving.  Clinically euvolemic. -Continue monitoring daily -Continue LR at 75 cc/h -Continue monitor I/O  Hypokalemia -continue to monitor -Mag and Phos levels tomorrow  Non-anion gap metabolic acidosis, possibly due to hyperalimentation -continue  to monitor  Acute on chronic anemia; drop overnight does not appear dilutional -Serial CBCs -We will check plan to transfuse for hemoglobin less than 7 or active bleeding with hemodynamic instability   Sinus tachycardia due to critical illness-improving -Continue to monitor on telemetry -Stable to stepdown to progressive care unit   Best practice:  Diet: TPN per pharmacy- on hold Pain/Anxiety/Delirium protocol (if indicated): PRN dialudid VAP protocol (if indicated):  na DVT prophylaxis: SCD; due to hemoglobin drop chemical DVT prophylaxis is contraindicated currently GI prophylaxis: PPI Glucose control: Dextrose containing fluids  mobility: Up to chair Code Status: Full  Family Communication: patient updated at bedside  Disposition: Stepdown to progressive care  Labs   CBC: Recent Labs  Lab 04/06/19 1840 04/07/19 0420 04/07/19 1329 04/08/19 1312  WBC 22.0* 11.3* 10.4 7.4  NEUTROABS 20.7* 9.2*  --   --   HGB 8.8* 7.1* 7.6* 7.5*  HCT 27.1* 22.6* 23.8* 24.0*  MCV 76.1* 76.6* 76.5* 76.9*  PLT 191 181 175 696    Basic Metabolic Panel: Recent Labs  Lab 04/06/19 1830 04/07/19 0421 04/07/19 1329 04/07/19 1945 04/08/19 0111 04/08/19 1312 04/09/19 0607  NA 121* 129* 129* 129* 128* 130* 131*  K 3.1* 2.9* 4.2 4.3 4.1 3.7 3.2*  CL 92* 103 100 100 99 99 101  CO2 15* 17* 23 21* 21* 22 19*  GLUCOSE 253* 136* 129* 120* 109* 90 115*  BUN 24* _0 5*  CREATININE 1.16 0.72 0.76 0.78 0.71 0.66 0.65  CALCIUM 7.6* 6.8* 7.7* 7.5* 7.5* 7.5* 7.3*  MG 1.6* 1.6*  --   --  2.0  --   --   PHOS 3.1 1.7*  --   --   --  2.1*  --    GFR: Estimated Creatinine Clearance: 121.3 mL/min (by C-G formula based on SCr of 0.65  mg/dL). Recent Labs  Lab 04/06/19 1830 04/06/19 1840 04/07/19 0420 04/07/19 1329 04/08/19 1312  PROCALCITON 31.27  --   --   --   --   WBC  --  22.0* 11.3* 10.4 7.4    Liver Function Tests: Recent Labs  Lab 04/06/19 1830 04/07/19 0421 04/09/19 0607  AST 203* 107* 53*  ALT 136* 90* 53*  ALKPHOS 109 82 81  BILITOT 2.2* 1.5* 1.1  PROT 6.6 5.3* 5.6*  ALBUMIN 1.6* 1.3* 1.3*   Recent Labs  Lab 04/06/19 1830  LIPASE 52*  AMYLASE 180*   No results for input(s): AMMONIA in the last 168 hours.  ABG No results found for: PHART, PCO2ART, PO2ART, HCO3, TCO2, ACIDBASEDEF, O2SAT   Coagulation Profile: No results for input(s): INR, PROTIME in the last 168 hours.  Cardiac Enzymes: No results for input(s): CKTOTAL, CKMB,  CKMBINDEX, TROPONINI in the last 168 hours.  HbA1C: Hgb A1c MFr Bld  Date/Time Value Ref Range Status  04/06/2019 06:40 PM 5.8 (H) 4.8 - 5.6 % Final    Comment:    (NOTE) Pre diabetes:          5.7%-6.4% Diabetes:              >6.4% Glycemic control for   <7.0% adults with diabetes     CBG: Recent Labs  Lab 04/08/19 1958 04/08/19 2033 04/09/19 0001 04/09/19 0413 04/09/19 0759  GLUCAP 65* 136* 98 107* 108*     This patient is critically ill with multiple organ system failure which requires frequent high complexity decision making, assessment, support, evaluation, and titration of therapies. This was completed through the application of advanced monitoring technologies and extensive interpretation of multiple databases. During this encounter critical care time was devoted to patient care services described in this note for 40 minutes.  Julian Hy, DO 04/09/19 12:36 PM [facility]

## 2019-04-09 NOTE — Progress Notes (Signed)
Patient ID: Charles Calhoun, male   DOB: 10/01/1970, 48 y.o.   MRN: [id number]          [facility]    Date of Admission:  04/06/2019  Day 4 vancomycin       Day 3 ceftriaxone and metronidazole         He is improving on therapy for MRSA bacteremia probably related to his recent PICC, acute pancreatitis and possibly infected pseudocyst.  There was no evidence of endocarditis by TTE.  Repeat cultures are negative so far.  If he has esophageal varices TEE would have a relative contraindication.  I will continue current antibiotics for now.  Michel Bickers, MD [facility] [phone] pager   [phone] cell 04/09/2019, 12:54 PM

## 2019-04-09 NOTE — Progress Notes (Signed)
PHARMACY - ADULT TOTAL PARENTERAL NUTRITION CONSULT NOTE   Pharmacy Consult for TPN Indication: pancreatitis  Patient Measurements: Height: 6' (182.9 cm) Weight: 190 lb 0.6 oz (86.2 kg) IBW/kg (Calculated) : 77.6 TPN AdjBW (KG): 85.9 Body mass index is 25.77 kg/m.  Assessment:  48 year old male presented with abdominal pain and vomiting. He has a history of alcohol abuse and cirrhosis. He complained of abdominal pain despite being NPO. Found to have pancreatic pseudocyst, decision was made to treat patient conservatively with NPO status and TPN for 6 weeks. TPN was initiated on 03/04/19 with potential end date of 04/15/19. Patient's care has been at [facility] in [place], [place]. Patient was transferred to [facility] on 10/29 after becoming septic and finding that the pseudocysts have become enlarged.  GI: On home TPN through [facility] in [place], may need to go back to [place] for endoscopic procedure Endo: cbgs 65-136 Insulin requirements in the past 24 hours: 0 units ssi Lytes:  Renal: Pulm: On RA Cards:  Hepatobil: AST/ALT 203/136 > 53/53, Tbili 1.5>1.1, TG 139 Neuro: A&O ID: MRSA bacteremia on vancomycin, flagyl, ceftriaxone  TPN Access: PICC pulled for bacteremia, will need new PICC prior to restarting TPN TPN start date: TBD Nutritional Goals (per RD recommendation on 10/30): KCal: 2500-2700 Protein: 125-135 Fluid: >2.4L  Current Nutrition:  NPO  Plan:  -PICC removed, needs BCx clear at least 48h before replacing.  F/u with ID recs and new PICC for TPN start next week -Awaiting TPN prescription to be faxed from [facility] [phone], not received yet over weekend will need to retry when open Monday  Bertis Ruddy, PharmD Clinical Pharmacist Please check AMION for all [place] numbers 04/09/2019 7:23 AM

## 2019-04-09 NOTE — Progress Notes (Signed)
[facility] Progress Note  CC: Pancreatic pseudocysts  Subjective: He continues to have left upper quadrant and left lower quadrant abdominal pain.  He stated the pain is no worse than it was yesterday.  No nausea or vomiting.  He is tolerating the NG tube to suction without distress.  He is passing gas per the rectum.  No bowel movement today.  Objective:   Repeat abdominal/pelvic CT 04/07/2019: Mild-to-moderate acute pancreatitis. Several pancreatic pseudocysts, largest measuring 9.8 cm. No evidence of pseudoaneurysm or other complication. 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 No evidence of hepatic neoplasm. Multiple sub-cm low-attenuation lesions in the posterior spleen which are nonspecific. Differential diagnosis includes small splenic infarcts and micro abscesses.  Shotty mesenteric and retroperitoneal lymphadenopathy, likely reactive in etiology. Recommend continued attention on follow-up imaging.Small right pleural effusion and dependent bibasilar atelectasis. Aortic Atherosclerosis   Vital signs in last 24 hours: Temp:  [98.8 F (37.1 C)-100.2 F (37.9 C)] 99.5 F (37.5 C) (11/01 0415) Pulse Rate:  [120-140] 126 (11/01 0600) Resp:  [19-29] 25 (11/01 0600) BP: (95-143)/(63-97) 107/66 (11/01 0600) SpO2:  [95 %-100 %] 97 % (11/01 0600) Weight:  [86.2 kg] 86.2 kg (11/01 0500) Last BM Date: 04/06/19 General:   Alert no acute distress. Eyes: Sclera nonicteric, conjunctiva pink Mouth: Poor dentition. Heart: Tachycardic, no murmurs. Pulm: Breath sounds slightly diminished in the bases bilaterally. Abdomen: His abdomen appears more distended today when compared to yesterday's exam, the left hepatic lobe is somewhat palpable, most likely has recurrence of ascites, his abdomen is not tense.  He  has hypoactive bowel sounds to all 4 quadrants with NG tube clamped. Extremities:   Without edema. Neurologic:  Alert and  oriented x4;  grossly normal neurologically. Psych:  Alert and cooperative. Normal mood and affect.  Intake/Output from previous day: 10/31 0701 - 11/01 0700 In: 3187.3 [I.V.:1845.5; IV Piggyback:1341.9] Out: 6962 [Urine:1175; Emesis/NG output:300] Intake/Output this shift: Total I/O In: 239.3 [I.V.:239.3] Out: 200 [Emesis/NG output:200]  Lab Results: Recent Labs    04/07/19 0420 04/07/19 1329 04/08/19 1312  WBC 11.3* 10.4 7.4  HGB 7.1* 7.6* 7.5*  HCT 22.6* 23.8* 24.0*  PLT 181 175 150   BMET Recent Labs    04/08/19 0111 04/08/19 1312 04/09/19 0607  NA 128* 130* 131*  K 4.1 3.7 3.2*  CL 99 99 101  CO2 21* 22 19*  GLUCOSE 109* 90 115*  BUN 10 8 5*  CREATININE 0.71 0.66 0.65  CALCIUM 7.5* 7.5* 7.3*   LFT Recent Labs    04/09/19 0607  PROT 5.6*  ALBUMIN 1.3*  AST 53*  ALT 53*  ALKPHOS 81  BILITOT 1.1   PT/INR No results for input(s): LABPROT, INR in the last 72 hours. Hepatitis Panel No results for input(s): HEPBSAG, HCVAB, HEPAIGM, HEPBIGM in the last 72 hours.  Ct Abdomen Pelvis W Wo Contrast  Result Date: 04/08/2019 CLINICAL DATA:  Acute pancreatitis. Fever and leukocytosis. Sepsis. Cirrhosis. EXAM: CT ABDOMEN AND PELVIS WITHOUT AND WITH CONTRAST TECHNIQUE: Multidetector CT imaging of the abdomen and pelvis was performed following the standard protocol before and following the bolus administration of intravenous contrast. CONTRAST:  174m OMNIPAQUE IOHEXOL 300 MG/ML  SOLN COMPARISON:  None. FINDINGS: Lower Chest: Small right pleural effusion and dependent bibasilar atelectasis. Hepatobiliary: Hepatic cirrhosis is demonstrated. Tiny cysts seen in central right hepatic lobe. No liver masses identified. Portal and hepatic veins are patent. Recanalization of paraumbilical veins is consistent with  portal venous hypertension. Pancreas: Mild-to-moderate acute pancreatitis is seen. No evidence of pancreatic mass or  calcifications. A rim enhancing fluid collection is seen along the superior and anterior portion of the pancreas which measures 9.8 x 6.2 cm. A smaller rim enhancing fluid collection is seen along the posterior aspect of the pancreatic head which measures 5.6 x 3.5 cm. A smaller 2 cm rim enhancing fluid collection is seen along the posterior aspect of the pancreatic tail. These are consistent with pancreatic pseudocysts. Spleen: Mild splenomegaly measuring approximately 14 cm in length, consistent with portal venous hypertension. No evidence of splenic vein thrombosis. Portosystemic venous collaterals noted in the left upper quadrant, consistent with portal venous hypertension. Multiple small ill-defined less than 1 cm low-attenuation lesions are seen in the posterior aspect of the spleen, which are nonspecific. Differential considerations include small splenic infarcts and micro abscesses. Adrenals/Urinary Tract: No masses identified. No evidence of hydronephrosis. Stomach/Bowel: Nasogastric tube tip in body of stomach. No evidence of obstruction, inflammatory process or abnormal fluid collections. Vascular/Lymphatic: Shotty lymph nodes measuring up to 1 cm are seen in the retroperitoneum and central small bowel mesentery, likely reactive in etiology. No pseudoaneurysms visualized. Aortic atherosclerosis. No evidence of abdominal aortic aneurysm. Reproductive:  No mass or other significant abnormality. Other:  Mild abdominal ascites. Musculoskeletal:  No suspicious bone lesions identified. IMPRESSION: Mild-to-moderate acute pancreatitis. Several pancreatic pseudocysts, largest measuring 9.8 cm. No evidence of pseudoaneurysm or other complication. 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 No evidence of hepatic neoplasm. Multiple sub-cm low-attenuation lesions in the posterior spleen which are nonspecific. Differential diagnosis includes small splenic  infarcts and micro abscesses. a Shotty mesenteric and retroperitoneal lymphadenopathy, likely reactive in etiology. Recommend continued attention on follow-up imaging. Small right pleural effusion and dependent bibasilar atelectasis. Aortic Atherosclerosis (ICD10-I70.0). Electronically Signed   By: Marlaine Hind M.D.   On: 04/08/2019 13:16   Dg Abd Portable 1v  Result Date: 04/08/2019 CLINICAL DATA:  NG tube placement EXAM: PORTABLE ABDOMEN - 1 VIEW COMPARISON:  04/07/2019 FINDINGS: The enteric tube projects over the gastric body. The tip is pointed distally. The bowel gas pattern is similar to prior study with distention of the cecum. No definite pneumatosis or free air identified on this exam. IMPRESSION: Enteric tube projects over the gastric body. Electronically Signed   By: Constance Holster M.D.   On: 04/08/2019 00:04    Assessment / Plan:  85.  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  An abdominal/pelvic CT 10/29 at [facility] identified enlarging pancreatic pseudocyst with new fluid collection with mass-effect concerning for developing gastric outlet obstruction.  He was transferred to [facility] for consideration of a cyst gastrostomy.   A repeat abdominal/pelvic CT 10/31 did not show significant change in size of the pancreatic pseudocysts. CRP 13.3. Alk phos 81. AST 53. ALT 53. He is afebrile. WBC yesterday 7.4. -Patient will need to transfer to [facility] facility for cyst gastrostomy, possibly to [facility] or [place] as the patient has [place].  Refer to Dr. Donneta Romberg note 04/08/2019. -He most likely will need an EGD with esophageal varices banding and to assess for gastric varices prior to proceeding with a cyst gastrostomy. -repeat CBC and CMP in am  2. Ileus. Repeat abd/pelvic CT 10/31 did not show evidence of obstruction.  -NPO. He is not ready for clear liquids at this time.  His abdomen remains  somewhat  distended with approximately 300 cc of NG tube drainage per shift.  He is passing gas per the rectum.  No BM. -Continue NGT to LIS  3. Acute Anemia. 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 He received 2 units of packed red blood cells at [facility].  Hemoglobin  No evidence of active GI bleeding.  No bowel movement yesterday or today. Hg 7.5 on 10/31. -FOBT with next BM. -Monitor NG tube drainage for bleeding -Monitor for melenic bowel movements or rectal bleeding -Repeat CBC in a.m.  4.  History of alcohol abuse.  Alcohol for 2 months.  5. Tachycardia secondary to pancreatitis  6. + MRSA nasal swab on isolation precautions.  MRSA bacteremia most likely secondary to his PICC line and less likely secondary to his pancreatic pseudocyst per ID.  7.  Hypokalemia. K 3.2.  -replacement per hospitalist  8.  Hyponatremia. Na 131.  9. Hypoalbuminemia. Albumin 1.3.  Further recommendations per Dr. Rush Landmark     Principal Problem:   MRSA bacteremia Active Problems:   Sepsis ([place])   Pancreatitis   Pancreatic pseudocyst   Cirrhosis ([place])   Diabetes mellitus ([place])   Alcohol abuse     LOS: 3 days   Noralyn Pick  04/09/2019, 12:28 PM

## 2019-04-10 ENCOUNTER — Inpatient Hospital Stay (HOSPITAL_COMMUNITY): Payer: Non-veteran care

## 2019-04-10 DIAGNOSIS — K7460 Unspecified cirrhosis of liver: Secondary | ICD-10-CM

## 2019-04-10 DIAGNOSIS — I8510 Secondary esophageal varices without bleeding: Secondary | ICD-10-CM

## 2019-04-10 DIAGNOSIS — T80219A Unspecified infection due to central venous catheter, initial encounter: Secondary | ICD-10-CM

## 2019-04-10 LAB — DIFFERENTIAL
Abs Immature Granulocytes: 0.12 10*3/uL — ABNORMAL HIGH (ref 0.00–0.07)
Basophils Absolute: 0 10*3/uL (ref 0.0–0.1)
Basophils Relative: 0 %
Eosinophils Absolute: 0.3 10*3/uL (ref 0.0–0.5)
Eosinophils Relative: 3 %
Immature Granulocytes: 1 %
Lymphocytes Relative: 10 %
Lymphs Abs: 0.8 10*3/uL (ref 0.7–4.0)
Monocytes Absolute: 1.3 10*3/uL — ABNORMAL HIGH (ref 0.1–1.0)
Monocytes Relative: 15 %
Neutro Abs: 6 10*3/uL (ref 1.7–7.7)
Neutrophils Relative %: 71 %

## 2019-04-10 LAB — CBC
HCT: 23.2 % — ABNORMAL LOW (ref 39.0–52.0)
HCT: 24.1 % — ABNORMAL LOW (ref 39.0–52.0)
Hemoglobin: 7.2 g/dL — ABNORMAL LOW (ref 13.0–17.0)
Hemoglobin: 7.3 g/dL — ABNORMAL LOW (ref 13.0–17.0)
MCH: 24.1 pg — ABNORMAL LOW (ref 26.0–34.0)
MCH: 23.9 pg — ABNORMAL LOW (ref 26.0–34.0)
MCHC: 31 g/dL (ref 30.0–36.0)
MCHC: 30.3 g/dL (ref 30.0–36.0)
MCV: 77.6 fL — ABNORMAL LOW (ref 80.0–100.0)
MCV: 78.8 fL — ABNORMAL LOW (ref 80.0–100.0)
Platelets: 215 10*3/uL (ref 150–400)
Platelets: 311 10*3/uL (ref 150–400)
RBC: 2.99 MIL/uL — ABNORMAL LOW (ref 4.22–5.81)
RBC: 3.06 MIL/uL — ABNORMAL LOW (ref 4.22–5.81)
RDW: 19.6 % — ABNORMAL HIGH (ref 11.5–15.5)
RDW: 19.9 % — ABNORMAL HIGH (ref 11.5–15.5)
WBC: 8.5 10*3/uL (ref 4.0–10.5)
WBC: 10 10*3/uL (ref 4.0–10.5)
nRBC: 0 % (ref 0.0–0.2)
nRBC: 0 % (ref 0.0–0.2)

## 2019-04-10 LAB — COMPREHENSIVE METABOLIC PANEL
ALT: 38 U/L (ref 0–44)
AST: 36 U/L (ref 15–41)
Albumin: 1.4 g/dL — ABNORMAL LOW (ref 3.5–5.0)
Alkaline Phosphatase: 81 U/L (ref 38–126)
Anion gap: 7 (ref 5–15)
BUN: <5 mg/dL — ABNORMAL LOW (ref 6–20)
CO2: 22 mmol/L (ref 22–32)
Calcium: 7.4 mg/dL — ABNORMAL LOW (ref 8.9–10.3)
Chloride: 106 mmol/L (ref 98–111)
Creatinine, Ser: 0.61 mg/dL (ref 0.61–1.24)
GFR calc Af Amer: >60 mL/min (ref >60)
GFR calc non Af Amer: >60 mL/min (ref >60)
Glucose, Bld: 112 mg/dL — ABNORMAL HIGH (ref 70–99)
Potassium: 3.2 mmol/L — ABNORMAL LOW (ref 3.5–5.1)
Sodium: 135 mmol/L (ref 135–145)
Total Bilirubin: 0.8 mg/dL (ref 0.3–1.2)
Total Protein: 5.7 g/dL — ABNORMAL LOW (ref 6.5–8.1)

## 2019-04-10 LAB — IRON AND TIBC
Iron: 13 ug/dL — ABNORMAL LOW (ref 45–182)
Saturation Ratios: 7 % — ABNORMAL LOW (ref 17.9–39.5)
TIBC: 179 ug/dL — ABNORMAL LOW (ref 250–450)
UIBC: 166 ug/dL

## 2019-04-10 LAB — VANCOMYCIN, TROUGH: Vancomycin Tr: 12 ug/mL — ABNORMAL LOW (ref 15–20)

## 2019-04-10 LAB — GLUCOSE, CAPILLARY
Glucose-Capillary: 100 mg/dL — ABNORMAL HIGH (ref 70–99)
Glucose-Capillary: 103 mg/dL — ABNORMAL HIGH (ref 70–99)
Glucose-Capillary: 111 mg/dL — ABNORMAL HIGH (ref 70–99)
Glucose-Capillary: 121 mg/dL — ABNORMAL HIGH (ref 70–99)
Glucose-Capillary: 84 mg/dL (ref 70–99)

## 2019-04-10 LAB — VANCOMYCIN, RANDOM: Vancomycin Rm: 29

## 2019-04-10 LAB — FERRITIN: Ferritin: 79 ng/mL (ref 24–336)

## 2019-04-10 LAB — VANCOMYCIN, PEAK: Vancomycin Pk: 11 ug/mL — ABNORMAL LOW (ref 30–40)

## 2019-04-10 LAB — PHOSPHORUS: Phosphorus: 2.5 mg/dL (ref 2.5–4.6)

## 2019-04-10 LAB — MAGNESIUM: Magnesium: 1.5 mg/dL — ABNORMAL LOW (ref 1.7–2.4)

## 2019-04-10 LAB — TSH: TSH: 7.813 u[IU]/mL — ABNORMAL HIGH (ref 0.350–4.500)

## 2019-04-10 LAB — TRIGLYCERIDES: Triglycerides: 89 mg/dL (ref <150)

## 2019-04-10 LAB — PREALBUMIN: Prealbumin: <5 mg/dL — ABNORMAL LOW (ref 18–38)

## 2019-04-10 MED ORDER — ALUM & MAG HYDROXIDE-SIMETH 200-200-20 MG/5ML PO SUSP: 30.0000 mL | Freq: Once | ORAL | Status: DC

## 2019-04-10 MED ORDER — MAGNESIUM SULFATE 2 GM/50ML IV SOLN
2.0000 g | Freq: Once | INTRAVENOUS | Status: AC
  Administered 2019-04-10: 09:00:00 2 g via INTRAVENOUS
  Filled 2019-04-10: qty 50

## 2019-04-10 MED ORDER — SODIUM CHLORIDE 0.9 % IV SOLN
INTRAVENOUS | Status: DC | PRN
  Administered 2019-04-10 – 2019-04-21 (×6): via INTRAVENOUS

## 2019-04-10 MED ORDER — POTASSIUM CHLORIDE 10 MEQ/100ML IV SOLN
10.0000 meq | INTRAVENOUS | Status: AC
  Administered 2019-04-10 (×4): 10 meq via INTRAVENOUS
  Filled 2019-04-10 (×5): qty 100

## 2019-04-10 NOTE — Progress Notes (Signed)
NAME:  Charles Calhoun, MRN:  [id number], DOB:  1971/04/27, LOS: 4 ADMISSION DATE:  04/06/2019, CONSULTATION DATE:  04/06/19 REFERRING MD:  Barnabas Harries, CHIEF COMPLAINT:  Abdominal pain  Brief History   48 yo M transferred from [facility] to [facility] MICU for higher level of care in setting of suspected sepsis, enlarging pancreatic pseudocysts.   History of present illness   48 yo M PMH EtOH abuse, Pancreatitis, who presented to [facility] with CC SOB. SOB began 2 days prior to presentation, onset gradually, and with associated symptoms of anorexia, abdominal pain, nausea, weakness and fever. Pain is dull in quality and moderate. No alleviating factors. Patient denies chest pain, vomiting, sick contacts, diarrhea, constipation.  CT abdomen concerning for acute pancreatitis and interval increase in size of pancreatic pseudocysts. Of note, patient with acute pancreatitis 9/26 for which he is receiving TPN via R PICC.  Glu 597, K 5.3, Ca 6.9, Sodium 112 AST 419 ALT 178 Alk phos 146 TBili 2.4  WBC 22,  Hgb  7.5 Lactic acid 6.0  PCT 15.85, BNP 142  Received vanc, zosyn at [facility]. Evaluated by surgeon who determined patient required higher level of care for medical management. Received 2 PRBC for acute anemia. Patient transferred to [facility] MICU.   Past Medical History  EtOH abuse- last drink 2 months ago Pancreatitis Pancreatic pseudocyst  DM with DKA   Significant Hospital Events   10/29 transferred from [facility] to [facility] MICU.  Consults:  IR GI ID  Procedures:  PICC removed 10/30  Significant Diagnostic Tests:   10/29 CXR> R PICC. No acute pulmonary abnormalities   10/29 CT a/p at [facility] enlarging pancreatic pseudocysts (10.3 x 6.7 cm from 7.8 x 6cm) with new fluid collection along lesser curvature of stomach. Second collection near posterior main portal vein 5.6cm (previously 5.1cm) Localized mass effect on gastric antrum with fluid in stomach and esophagus-- suspicious for developing  gastric outlet obstruction. Increased peripancreatic stranding concerning for acute pancreatitis. Mild ascites.   Micro Data:  10/29 SARS Cov2> negative (performed at [facility])  04/06/2019 blood cultures-MRSA 10/31 blood cultures >>  Antimicrobials:  10/29 Vanc 10/29 Meropenem x 1 dose  Ceftriaxone 10/30>>  Interim history/subjective:  Continues to have abdominal pain. Unchanged. States he is mobilizing to chair. No nausea, no passing flatus.  Objective   Blood pressure 111/75, pulse (!) 122, temperature 98.3 F (36.8 C), temperature source Oral, resp. rate (!) 21, height 6' (1.829 m), weight 85.2 kg, SpO2 99 %.        Intake/Output Summary (Last 24 hours) at 04/10/2019 1127 Last data filed at 04/10/2019 1023 Gross per 24 hour  Intake 4042.79 ml  Output 2575 ml  Net 1467.79 ml   I/O + 5L since admission Filed Weights   04/08/19 0500 04/09/19 0500 04/10/19 0500  Weight: 86.1 kg 86.2 kg 85.2 kg    Examination: General: chronically- ill appearing man laying in bed in NAD, stable compared to previous exams HENT: [place]/AT, eyes anicteric  neck: no JVD CV:  tachycardic, regular rhythm, no murmurs.  Sinus rhythm on telemetry Lungs: Breathing comfortably on room air, minimal tachypnea.  Clear to auscultation bilaterally ABD:  Protuberant abdomen, mildly distended and tympanic to percussion.  Tender to palpation.  Active bowel sounds.  Copious NGT drainage. EXT: No clubbing, cyanosis, edema Skin: No rashes or wounds Neuro: Alert, answering questions appropriately with normal speech.  Moving all extremities spontaneously. 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 Has been on TPN out pt since September 2020 s/p d/c from the hospital in [place] for treatment of same.   Sepsis due to S.aureus  bacteremia likely from  infected pancreatic pseudocyst or PICC line (now removed).  PICC line infection would not account for abdominal pain and ileus, suggesting that pancreas is the source..  Transthoracic echocardiogram without evidence of endocarditis.  Repeat blood cultures are negative.  -Continue Vanco, ceftriaxone, metronidazole -Continue to follow cultures -Appreciate ID's assistance, agree with their recommendations.  -Appreciate GIs assistance.  Unfortunately due to his insurance issues and the nature of the procedures he would need with required follow-up procedures, they are unable to perform these.  I have contacted [facility] and he is on the waiting list for transfer to a progressive care unit bed.  They will call our on-call pager when a bed is available to obtain a formal signout (preliminary information given).  Ileus -Continue n.p.o. and NG tube to low intermittent wall suction -Limit opiates as possible  Mild hypoglycemia -Continue dextrose and IV fluids -Continue Accu-Cheks every 4 hours  -Goal BG 140-180 while admitted if requiring insulin  Hx EtOH abuse; last drink 2 months ago - Off TPN since central access removed - IV thiamine - Will likely require TPN again to until pseudocyst is drained.   Hyponatremia-slowly improving.  Clinically euvolemic. -Continue monitoring daily -Continue LR at 75 cc/h -Continue monitor I/O  Hypokalemia -continue to monitor -Mag and Phos levels tomorrow  Non-anion gap metabolic acidosis, possibly due to hyperalimentation -continue  to monitor  Acute on chronic anemia; drop overnight does not appear dilutional -Serial CBCs -We will check plan to transfuse for hemoglobin less than 7 or active bleeding with hemodynamic instability   Sinus tachycardia due to critical illness-improving -Continue to monitor on telemetry -Stable to stepdown to progressive care unit   Best practice:  Diet: TPN per pharmacy- on hold  Pain/Anxiety/Delirium protocol (if indicated): PRN dialudid VAP protocol (if indicated): na DVT prophylaxis: SCD; due to hemoglobin drop chemical DVT prophylaxis is contraindicated currently GI prophylaxis: PPI Glucose control: Dextrose containing fluids  mobility: Up to chair Code Status: Full  Family Communication: patient updated at bedside  Disposition: Stepdown to progressive care  Labs   CBC: Recent Labs  Lab 04/06/19 1840 04/07/19 0420 04/07/19 1329 04/08/19 1312 04/10/19 0435  WBC 22.0* 11.3* 10.4 7.4 8.5  NEUTROABS 20.7* 9.2*  --   --  6.0  HGB 8.8* 7.1* 7.6* 7.5* 7.2*  HCT 27.1* 22.6* 23.8* 24.0* 23.2*  MCV 76.1* 76.6* 76.5* 76.9* 77.6*  PLT 191 181 175 150 798    Basic Metabolic Panel: Recent Labs  Lab 04/06/19 1830 04/07/19 0421  04/07/19 1945 04/08/19 0111 04/08/19 1312 04/09/19 0607 04/10/19 0435  NA 121* 129*   < > 129* 128* 130* 131* 135  K 3.1* 2.9*   < > 4.3 4.1 3.7 3.2* 3.2*  CL 92* 103   < > 100 99 99 101 106  CO2 15* 17*   < > 21* 21* 22 19* 22  GLUCOSE 253* 136*   < > 120* 109* 90 115* 112*  BUN 24* 17   < > _0 5* <5*  CREATININE 1.16 0.72   < > 0.78 0.71 0.66 0.65 0.61  CALCIUM 7.6* 6.8*   < > 7.5* 7.5* 7.5* 7.3* 7.4*  MG  1.6* 1.6*  --   --  2.0  --   --  1.5*  PHOS 3.1 1.7*  --   --   --  2.1*  --  2.5   < > = values in this interval not displayed.   GFR: Estimated Creatinine Clearance: 121.3 mL/min (by C-G formula based on SCr of 0.61 mg/dL). Recent Labs  Lab 04/06/19 1830  04/07/19 0420 04/07/19 1329 04/08/19 1312 04/10/19 0435  PROCALCITON 31.27  --   --   --   --   --   WBC  --    < > 11.3* 10.4 7.4 8.5   < > = values in this interval not displayed.    Liver Function Tests: Recent Labs  Lab 04/06/19 1830 04/07/19 0421 04/09/19 0607 04/10/19 0435  AST 203* 107* 53* 36  ALT 136* 90* 53* 38  ALKPHOS 109 82 81 81  BILITOT 2.2* 1.5* 1.1 0.8  PROT 6.6 5.3* 5.6* 5.7*  ALBUMIN 1.6* 1.3* 1.3* 1.4*   Recent Labs   Lab 04/06/19 1830  LIPASE 52*  AMYLASE 180*   No results for input(s): AMMONIA in the last 168 hours.  ABG No results found for: PHART, PCO2ART, PO2ART, HCO3, TCO2, ACIDBASEDEF, O2SAT   Coagulation Profile: No results for input(s): INR, PROTIME in the last 168 hours.  Cardiac Enzymes: No results for input(s): CKTOTAL, CKMB, CKMBINDEX, TROPONINI in the last 168 hours.  HbA1C: Hgb A1c MFr Bld  Date/Time Value Ref Range Status  04/06/2019 06:40 PM 5.8 (H) 4.8 - 5.6 % Final    Comment:    (NOTE) Pre diabetes:          5.7%-6.4% Diabetes:              >6.4% Glycemic control for   <7.0% adults with diabetes     CBG: Recent Labs  Lab 04/09/19 1633 04/09/19 2023 04/10/19 0011 04/10/19 0415 04/10/19 [place] Cherrill Scrima, MD [facility] ICU Physician [place]  Pager: [phone] Mobile: [phone] After hours: [phone].

## 2019-04-10 NOTE — Progress Notes (Signed)
NAME:  Charles Calhoun, MRN:  [id number], DOB:  Mar 23, 1971, LOS: 4 ADMISSION DATE:  04/06/2019, CONSULTATION DATE:  04/06/19 REFERRING MD:  Barnabas Harries, CHIEF COMPLAINT:  Abdominal pain  Brief History   48 yo M transferred from [facility] to [facility] MICU for higher level of care in setting of suspected sepsis, enlarging pancreatic pseudocysts.   History of present illness   48 yo M PMH EtOH abuse, Pancreatitis, who presented to [facility] with CC SOB. SOB began 2 days prior to presentation, onset gradually, and with associated symptoms of anorexia, abdominal pain, nausea, weakness and fever. Pain is dull in quality and moderate. No alleviating factors. Patient denies chest pain, vomiting, sick contacts, diarrhea, constipation.  CT abdomen concerning for acute pancreatitis and interval increase in size of pancreatic pseudocysts. Of note, patient with acute pancreatitis 9/26 for which he is receiving TPN via R PICC.  Glu 597, K 5.3, Ca 6.9, Sodium 112 AST 419 ALT 178 Alk phos 146 TBili 2.4  WBC 22,  Hgb  7.5 Lactic acid 6.0  PCT 15.85, BNP 142  Received vanc, zosyn at [facility]. Evaluated by surgeon who determined patient required higher level of care for medical management. Received 2 PRBC for acute anemia. Patient transferred to [facility] MICU.   Past Medical History  EtOH abuse- last drink 2 months ago Pancreatitis Pancreatic pseudocyst  DM with DKA   Significant Hospital Events   10/29 transferred from [facility] to [facility]U.  Consults:  IR GI ID  Procedures:  PICC removed 10/30  Significant Diagnostic Tests:   10/29 CXR> R PICC. No acute pulmonary abnormalities   10/29 CT a/p at [facility] enlarging pancreatic pseudocysts (10.3 x 6.7 cm from 7.8 x 6cm) with new fluid collection along lesser curvature of stomach. Second collection near posterior main portal vein 5.6cm (previously 5.1cm) Localized mass effect on gastric antrum with fluid in stomach and esophagus-- suspicious for developing  gastric outlet obstruction. Increased peripancreatic stranding concerning for acute pancreatitis. Mild ascites.   Micro Data:  10/29 SARS Cov2> negative (performed at [facility])  04/06/2019 blood cultures-MRSA 10/31 blood cultures >>  Antimicrobials:  10/29 Vanc 10/29 Meropenem x 1 dose  Ceftriaxone 10/30>>  Interim history/subjective:  Continues to have abdominal pain. Unchanged. States he is mobilizing to chair. No nausea, no passing flatus.  Objective   Blood pressure 118/85, pulse (!) 127, temperature 98.9 F (37.2 C), temperature source Oral, resp. rate (!) 24, height 6' (1.829 m), weight 85.2 kg, SpO2 97 %.        Intake/Output Summary (Last 24 hours) at 04/10/2019 0756 Last data filed at 04/10/2019 0500 Gross per 24 hour  Intake 3451.85 ml  Output 2450 ml  Net 1001.85 ml   I/O + 5L since admission Filed Weights   04/08/19 0500 04/09/19 0500 04/10/19 0500  Weight: 86.1 kg 86.2 kg 85.2 kg    Examination: General: chronically- ill appearing man laying in bed in NAD, stable compared to previous exams HENT: [place]/AT, eyes anicteric  neck: no JVD CV:  tachycardic, regular rhythm, no murmurs.  Sinus rhythm on telemetry Lungs: Breathing comfortably on room air, minimal tachypnea.  Clear to auscultation bilaterally ABD:  Protuberant abdomen, mildly distended and tympanic to percussion.  Tender to palpation.  Active bowel sounds.  Copious NGT drainage. EXT: No clubbing, cyanosis, edema Skin: No rashes or wounds Neuro: Alert, answering questions appropriately with normal speech.  Moving all extremities spontaneously. 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 Has been on TPN out pt since September 2020 s/p d/c from the hospital in [place] for treatment of same.   Sepsis due to S.aureus  bacteremia likely from  infected pancreatic pseudocyst or PICC line (now removed).  PICC line infection would not account for abdominal pain and ileus, suggesting that pancreas is the source..  Transthoracic echocardiogram without evidence of endocarditis.  Repeat blood cultures are negative.  -Continue Vanco, ceftriaxone, metronidazole -Continue to follow cultures -Appreciate ID's assistance -Appreciate GIs assistance.  Unfortunately due to his insurance issues and the nature of the procedures he would need with required follow-up procedures, they are unable to perform these.  I have contacted [place] clinic and he is on the waiting list for transfer to a progressive care unit bed.  They will call our on-call pager when a bed is available to obtain a formal signout (preliminary information given).  Ileus -Continue n.p.o. and NG tube to low intermittent wall suction -Limit opiates as possible  Mild hypoglycemia -Continue dextrose and IV fluids -Continue Accu-Cheks every 4 hours  -Goal BG 140-180 while admitted if requiring insulin  Hx EtOH abuse; last drink 2 months ago - Off TPN since central access removed - IV thiamine - Will likely require TPN again to until pseudocyst is drained.   Hyponatremia-slowly improving.  Clinically euvolemic. -Continue monitoring daily -Continue LR at 75 cc/h -Continue monitor I/O  Hypokalemia -continue to monitor -Mag and Phos levels tomorrow  Non-anion gap metabolic acidosis, possibly due to hyperalimentation -continue  to monitor  Acute on chronic anemia; drop overnight does not appear dilutional -Serial CBCs -We will check plan to transfuse for hemoglobin less than 7 or active bleeding with hemodynamic instability   Sinus tachycardia due to critical illness-improving -Continue to monitor on telemetry -Stable to stepdown to progressive care unit   Best practice:  Diet: TPN per pharmacy- on hold Pain/Anxiety/Delirium protocol (if  indicated): PRN dialudid VAP protocol (if indicated): na DVT prophylaxis: SCD; due to hemoglobin drop chemical DVT prophylaxis is contraindicated currently GI prophylaxis: PPI Glucose control: Dextrose containing fluids  mobility: Up to chair Code Status: Full  Family Communication: patient updated at bedside  Disposition: Stepdown to progressive care  Labs   CBC: Recent Labs  Lab 04/06/19 1840 04/07/19 0420 04/07/19 1329 04/08/19 1312 04/10/19 0435  WBC 22.0* 11.3* 10.4 7.4 8.5  NEUTROABS 20.7* 9.2*  --   --  6.0  HGB 8.8* 7.1* 7.6* 7.5* 7.2*  HCT 27.1* 22.6* 23.8* 24.0* 23.2*  MCV 76.1* 76.6* 76.5* 76.9* 77.6*  PLT 191 181 175 150 419    Basic Metabolic Panel: Recent Labs  Lab 04/06/19 1830 04/07/19 0421  04/07/19 1945 04/08/19 0111 04/08/19 1312 04/09/19 0607 04/10/19 0435  NA 121* 129*   < > 129* 128* 130* 131* 135  K 3.1* 2.9*   < > 4.3 4.1 3.7 3.2* 3.2*  CL 92* 103   < > 100 99 99 101 106  CO2 15* 17*   < > 21* 21* 22 19* 22  GLUCOSE 253* 136*   < > 120* 109* 90 115* 112*  BUN 24* 17   < > '12 10 8 ' 5* <5*  CREATININE 1.16 0.72   < > 0.78 0.71 0.66 0.65 0.61  CALCIUM 7.6* 6.8*   < > 7.5* 7.5* 7.5* 7.3* 7.4*  MG 1.6* 1.6*  --   --  2.0  --   --  1.5*  PHOS 3.1 1.7*  --   --   --  2.1*  --  2.5   < > = values in this interval not displayed.   GFR: Estimated Creatinine Clearance: 121.3 mL/min (by C-G formula based on SCr of 0.61 mg/dL). Recent Labs  Lab 04/06/19 1830  04/07/19 0420 04/07/19 1329 04/08/19 1312 04/10/19 0435  PROCALCITON 31.27  --   --   --   --   --   WBC  --    < > 11.3* 10.4 7.4 8.5   < > = values in this interval not displayed.    Liver Function Tests: Recent Labs  Lab 04/06/19 1830 04/07/19 0421 04/09/19 0607 04/10/19 0435  AST 203* 107* 53* 36  ALT 136* 90* 53* 38  ALKPHOS 109 82 81 81  BILITOT 2.2* 1.5* 1.1 0.8  PROT 6.6 5.3* 5.6* 5.7*  ALBUMIN 1.6* 1.3* 1.3* 1.4*   Recent Labs  Lab 04/06/19 1830  LIPASE 52*   AMYLASE 180*   No results for input(s): AMMONIA in the last 168 hours.  ABG No results found for: PHART, PCO2ART, PO2ART, HCO3, TCO2, ACIDBASEDEF, O2SAT   Coagulation Profile: No results for input(s): INR, PROTIME in the last 168 hours.  Cardiac Enzymes: No results for input(s): CKTOTAL, CKMB, CKMBINDEX, TROPONINI in the last 168 hours.  HbA1C: Hgb A1c MFr Bld  Date/Time Value Ref Range Status  04/06/2019 06:40 PM 5.8 (H) 4.8 - 5.6 % Final    Comment:    (NOTE) Pre diabetes:          5.7%-6.4% Diabetes:              >6.4% Glycemic control for   <7.0% adults with diabetes     CBG: Recent Labs  Lab 04/09/19 1256 04/09/19 1633 04/09/19 2023 04/10/19 0011 04/10/19 [place] Radha Coggins, [place] ICU Physician [place]  Pager: [phone] Mobile: [phone] After hours: [phone].

## 2019-04-10 NOTE — Progress Notes (Signed)
[place] CONSULT NOTE   Pharmacy Consult for TPN Indication: pancreatitis  Patient Measurements: Height: 6' (182.9 cm) Weight: 187 lb 13.3 oz (85.2 kg) IBW/kg (Calculated) : 77.6 TPN AdjBW (KG): 85.9 Body mass index is 25.47 kg/m.  Assessment:  48 year old male presented with abdominal pain and vomiting. He has a history of alcohol abuse and cirrhosis. He complained of abdominal pain despite being NPO. Found to have pancreatic pseudocyst, decision was made to treat patient conservatively with NPO status and TPN for 6 weeks. TPN was initiated on 03/04/19 with potential end date of 04/15/19. Patient's care has been at [facility] in [place], [place]. Patient was transferred to [facility] on 10/29 after becoming septic and finding that the pseudocysts have become enlarged.  GI: On home TPN through [facility] in [place], may need to go back to [place] for endoscopic procedure; 500 mls from NG tube; prealbumin <5 Endo: cbgs 92-121 Insulin requirements in the past 24 hours: 0 units ssi Lytes: Potassium 3.2 (replaced) and Mag 1.5 (replaced) Renal: Scr 0.61 Pulm: On RA Cards:  Hepatobil: LFTs within normal limits Neuro: A&O ID: MRSA bacteremia on vancomycin, flagyl, ceftriaxone  TPN Access: PICC pulled for bacteremia, will need new PICC prior to restarting TPN TPN start date: TBD Nutritional Goals (per RD recommendation on 10/30): KCal: 2500-2700 Protein: 125-135 Fluid: >2.4L  Current Nutrition:  NPO  Plan:  -PICC removed, needs BCx clear at least 48h before replacing.  F/u with ID recs and new PICC for TPN start next week -Requested TPN prescription to be faxed from [place]   Alanda Slim, PharmD, [facility] Clinical Pharmacist Please see AMION for all Pharmacists' Contact Phone Numbers 04/10/2019, 8:59 AM

## 2019-04-10 NOTE — Progress Notes (Signed)
NG tube dislodged while NT was giving bath, GI made aware, verbal order to replace given.

## 2019-04-10 NOTE — Progress Notes (Signed)
[facility] for Infectious Disease  Date of Admission:  04/06/2019      Total days of antibiotics 5  Day 5 vancomycin   Day 4 ceftriaxone   Day 4 metronidazole            ASSESSMENT: Charles Calhoun is a 48 y.o. male with MRSA bacteremia in the setting of PICC placement x 1 month for chronic TPN in the setting of pancreatitis / pancreatic pseudocyst.   MRSA Bacteremia = likely related to PICC line. Cultures are clear beyond 48 hours however he did have a fever last PM to over 101 F. If he develops fever again would repeat blood cultures; otherwise can plan for PICC line to be replaced if PCCM/GI feel he will need to continue TPN.  Will ask cardiology to see for consideration of TEE is doable to help ensure short course of IV therapy is safe.   Pancreatic pseudocyst = GI has seen him and unable to perform cystoscopy at this time. Unable to determine if this is infected at this time. He was febrile over night. Will continue metronidazole + ceftriaxone on presumption these may be infected.   Cirrhosis with Esophageal Varices = patient indicated that he had some variceal banding done at some point. GI was planning EGD to screen for gastric varices prior to cyst gastrostomy but now deferred due to hope he will be transferred to center that can assist with providing continuity of care following this procedure.    PLAN: 1. Continue vancomycin  2. Continue ceftriaxone + metronidazole  3. Repeat blood cultures if again has fever > 100.5 F  4. Hold on PICC until afebrile x 24h.  5. Will ask cardiology to see for consideration of TEE   Principal Problem:   MRSA bacteremia Active Problems:   Pancreatitis   Pancreatic pseudocyst   Sepsis ([place])   Cirrhosis ([place])   Diabetes mellitus ([place])   Alcohol abuse   . Chlorhexidine Gluconate Cloth  6 each Topical Q0600  . enoxaparin (LOVENOX) injection  40 mg Subcutaneous Q24H  . insulin aspart  0-15 Units Subcutaneous Q4H  .  mupirocin ointment  1 application Nasal BID  . pantoprazole (PROTONIX) IV  40 mg Intravenous Q12H  . thiamine injection  100 mg Intravenous Daily    SUBJECTIVE: No new complaints. Feels as if he is passing some flatus occasionally. Did not fee; badly with fever last PM.   Review of Systems: Review of Systems  Constitutional: Positive for fever. Negative for chills, diaphoresis and malaise/fatigue.  Respiratory: Negative for cough and shortness of breath.   Cardiovascular: Negative for chest pain and leg swelling.  Gastrointestinal: Negative for abdominal pain, nausea and vomiting.  Genitourinary: Negative for dysuria.  Musculoskeletal: Negative for back pain and joint pain.  Neurological: Positive for weakness.    No Known Allergies  OBJECTIVE: Vitals:   04/10/19 0758 04/10/19 0800 04/10/19 0900 04/10/19 1000  BP:  121/83 115/78 111/75  Pulse:  (!) 126 (!) 124 (!) 122  Resp:  (!) 21 (!) 22 (!) 21  Temp: 98.3 F (36.8 C)     TempSrc: Oral     SpO2:  99% 95% 99%  Weight:      Height:       Body mass index is 25.47 kg/m.  Physical Exam Vitals signs and nursing note reviewed.  Constitutional:      Comments: Resting in bed watching TV comfortably.   HENT:  Mouth/Throat:     Mouth: Mucous membranes are dry.  Eyes:     General: No scleral icterus.    Pupils: Pupils are equal, round, and reactive to light.  Neck:     Musculoskeletal: Normal range of motion.  Cardiovascular:     Rate and Rhythm: Regular rhythm. Tachycardia present.     Heart sounds: No murmur.  Abdominal:     General: There is distension.     Tenderness: Tenderness: improved. There is no guarding.     Comments: hypoactive bowel sounds   Skin:    General: Skin is warm and dry.     Capillary Refill: Capillary refill takes less than 2 seconds.  Neurological:     Mental Status: He is oriented to person, place, and time.  Psychiatric:        Mood and Affect: Mood normal.     Lab Results Lab  Results  Component Value Date   WBC 8.5 04/10/2019   HGB 7.2 (L) 04/10/2019   HCT 23.2 (L) 04/10/2019   MCV 77.6 (L) 04/10/2019   PLT 215 04/10/2019    Lab Results  Component Value Date   CREATININE 0.61 04/10/2019   BUN <5 (L) 04/10/2019   NA 135 04/10/2019   K 3.2 (L) 04/10/2019   CL 106 04/10/2019   CO2 22 04/10/2019    Lab Results  Component Value Date   ALT 38 04/10/2019   AST 36 04/10/2019   ALKPHOS 81 04/10/2019   BILITOT 0.8 04/10/2019     Microbiology: Recent Results (from the past 240 hour(s))  Culture, blood (routine x 2)     Status: Abnormal   Collection Time: 04/06/19  5:25 PM   Specimen: Site Not Specified; Blood  Result Value Ref Range Status   Specimen Description SITE NOT SPECIFIED  Final   Special Requests AEROBIC BOTTLE ONLY Blood Culture adequate volume  Final   Culture  Setup Time   Final    GRAM POSITIVE COCCI IN CLUSTERS AEROBIC BOTTLE ONLY CRITICAL VALUE NOTED.  VALUE IS CONSISTENT WITH PREVIOUSLY REPORTED AND CALLED VALUE.    Culture (A)  Final    STAPHYLOCOCCUS AUREUS SUSCEPTIBILITIES PERFORMED ON PREVIOUS CULTURE WITHIN THE LAST 5 DAYS. Performed at [facility], [address], [place], [place] [address]    Report Status 04/09/2019 FINAL  Final  Culture, blood (routine x 2)     Status: Abnormal   Collection Time: 04/06/19  5:25 PM   Specimen: Site Not Specified; Blood  Result Value Ref Range Status   Specimen Description SITE NOT SPECIFIED  Final   Special Requests AEROBIC BOTTLE ONLY Blood Culture adequate volume  Final   Culture  Setup Time   Final    GRAM POSITIVE COCCI IN CLUSTERS AEROBIC BOTTLE ONLY Organism ID to follow CRITICAL RESULT CALLED TO, READ BACK BY AND VERIFIED WITH: Gala Lewandowsky PharmD 9:15 04/07/19 ([email or web address]) Performed at [facility], [address], [place], [place] [address]    Culture METHICILLIN RESISTANT STAPHYLOCOCCUS AUREUS (A)  Final   Report Status 04/09/2019 FINAL  Final   Organism ID,  Bacteria METHICILLIN RESISTANT STAPHYLOCOCCUS AUREUS  Final      Susceptibility   Methicillin resistant staphylococcus aureus - MIC*    CIPROFLOXACIN >=8 RESISTANT Resistant     ERYTHROMYCIN >=8 RESISTANT Resistant     GENTAMICIN <=0.5 SENSITIVE Sensitive     OXACILLIN >=4 RESISTANT Resistant     TETRACYCLINE >=16 RESISTANT Resistant     VANCOMYCIN <=  0.5 SENSITIVE Sensitive     TRIMETH/SULFA <=10 SENSITIVE Sensitive     CLINDAMYCIN >=8 RESISTANT Resistant     RIFAMPIN <=0.5 SENSITIVE Sensitive     Inducible Clindamycin NEGATIVE Sensitive     * METHICILLIN RESISTANT STAPHYLOCOCCUS AUREUS  Blood Culture ID Panel (Reflexed)     Status: Abnormal   Collection Time: 04/06/19  5:25 PM  Result Value Ref Range Status   Enterococcus species NOT DETECTED NOT DETECTED Final   Listeria monocytogenes NOT DETECTED NOT DETECTED Final   Staphylococcus species DETECTED (A) NOT DETECTED Final    Comment: CRITICAL RESULT CALLED TO, READ BACK BY AND VERIFIED WITH: Gala Lewandowsky PharmD 9:15 04/07/19 ([email or web address])    Staphylococcus aureus (BCID) DETECTED (A) NOT DETECTED Final    Comment: Methicillin (oxacillin)-resistant Staphylococcus aureus (MRSA). MRSA is predictably resistant to beta-lactam antibiotics (except ceftaroline). Preferred therapy is vancomycin unless clinically contraindicated. Patient requires contact precautions if  hospitalized. CRITICAL RESULT CALLED TO, READ BACK BY AND VERIFIED WITH: Gala Lewandowsky PharmD 9:15 04/07/19 ([email or web address])    Methicillin resistance DETECTED (A) NOT DETECTED Final    Comment: CRITICAL RESULT CALLED TO, READ BACK BY AND VERIFIED WITH: Gala Lewandowsky PharmD 9:15 04/07/19 ([email or web address])    Streptococcus species NOT DETECTED NOT DETECTED Final   Streptococcus agalactiae NOT DETECTED NOT DETECTED Final   Streptococcus pneumoniae NOT DETECTED NOT DETECTED Final   Streptococcus pyogenes NOT DETECTED NOT DETECTED Final   Acinetobacter baumannii NOT DETECTED NOT DETECTED Final    Enterobacteriaceae species NOT DETECTED NOT DETECTED Final   Enterobacter cloacae complex NOT DETECTED NOT DETECTED Final   Escherichia coli NOT DETECTED NOT DETECTED Final   Klebsiella oxytoca NOT DETECTED NOT DETECTED Final   Klebsiella pneumoniae NOT DETECTED NOT DETECTED Final   Proteus species NOT DETECTED NOT DETECTED Final   Serratia marcescens NOT DETECTED NOT DETECTED Final   Haemophilus influenzae NOT DETECTED NOT DETECTED Final   Neisseria meningitidis NOT DETECTED NOT DETECTED Final   Pseudomonas aeruginosa NOT DETECTED NOT DETECTED Final   Candida albicans NOT DETECTED NOT DETECTED Final   Candida glabrata NOT DETECTED NOT DETECTED Final   Candida krusei NOT DETECTED NOT DETECTED Final   Candida parapsilosis NOT DETECTED NOT DETECTED Final   Candida tropicalis NOT DETECTED NOT DETECTED Final    Comment: Performed at [facility], [address], [place], [place] [address]  MRSA PCR Screening     Status: Abnormal   Collection Time: 04/06/19  7:05 PM   Specimen: Nasal Mucosa; Nasopharyngeal  Result Value Ref Range Status   MRSA by PCR POSITIVE (A) NEGATIVE Final    Comment:        The GeneXpert MRSA Assay (FDA approved for NASAL specimens only), is one component of a comprehensive MRSA colonization surveillance program. It is not intended to diagnose MRSA infection nor to guide or monitor treatment for MRSA infections. RESULT CALLED TO, READ BACK BY AND VERIFIED WITH: Milford Cage RN 04/06/19 2102 JDW Performed at [facility], [address] [place], [place] [address]   Urine culture     Status: None   Collection Time: 04/07/19  4:21 AM   Specimen: Urine, Random  Result Value Ref Range Status   Specimen Description URINE, RANDOM  Final   Special Requests NONE  Final   Culture   Final    NO GROWTH Performed at [facility], [address], [place], [place] [address]    Report Status 04/07/2019 FINAL  Final  Culture,  blood (routine x 2)      Status: None (Preliminary result)   Collection Time: 04/08/19  1:19 AM   Specimen: BLOOD  Result Value Ref Range Status   Specimen Description BLOOD RIGHT ANTECUBITAL  Final   Special Requests   Final    BOTTLES DRAWN AEROBIC AND ANAEROBIC Blood Culture results may not be optimal due to an excessive volume of blood received in culture bottles   Culture   Final    NO GROWTH 2 DAYS Performed at [facility], [address] [place], [place] [address]    Report Status PENDING  Incomplete  Culture, blood (routine x 2)     Status: None (Preliminary result)   Collection Time: 04/08/19  1:19 AM   Specimen: BLOOD  Result Value Ref Range Status   Specimen Description BLOOD RIGHT ANTECUBITAL  Final   Special Requests   Final    BOTTLES DRAWN AEROBIC AND ANAEROBIC Blood Culture adequate volume   Culture   Final    NO GROWTH 2 DAYS Performed at [facility], [address] [place], [place] [address]    Report Status PENDING  Incomplete     Rexene AlbertsStephanie , MSN, NP-C [facility]  [email or web address] Pager: [phone] Office: [phone] [facility] Main Line: [phone]

## 2019-04-10 NOTE — Progress Notes (Addendum)
Pharmacy Antibiotic Note  Charles Calhoun is a 48 y.o. male admitted on 04/06/2019 as a transfer from [facility] for higher level of care in setting of suspected sepsis from pancreatitis and enlarging pancreatic pseudocysts. Pharmacy is consulted for vancomycin for MRSA bacteremia. Blood cultures on 04/06/2019 are positive for 2/2 GPC in clusters and BCID identified MRSA. Patient also receiving ceftriaxone, and metronidazole.   Vancomycin 1500 mg IV Q 12 hrs. Goal AUC 400-550. Based on drawn Vancomycin peak of 29 and trough of 12 our calculated AUC is 524 which is therapeutic for bacteremia treatment.  Scr stable, WBC wnl, currently afebrile previous fever spike last PM.   Plan: Continue vancomycin 1500mg  IV q12h. Monitor renal function, cultures/sensitivites, and clinical progression  Height: 6' (182.9 cm) Weight: 187 lb 13.3 oz (85.2 kg) IBW/kg (Calculated) : 77.6  Temp (24hrs), Avg:99.8 F (37.7 C), Min:98.7 F (37.1 C), Max:101.9 F (38.8 C)  Recent Labs  Lab 04/06/19 1840 04/07/19 0420  04/07/19 1329 04/07/19 1945 04/08/19 0111 04/08/19 1312 04/09/19 0607 04/10/19 0435  WBC 22.0* 11.3*  --  10.4  --   --  7.4  --  8.5  CREATININE  --   --    < > 0.76 0.78 0.71 0.66 0.65 0.61  VANCOPEAK  --   --   --   --   --   --   --   --  11*   < > = values in this interval not displayed.    Estimated Creatinine Clearance: 121.3 mL/min (by C-G formula based on SCr of 0.61 mg/dL).    No Known Allergies  Antimicrobials this admission: Meropenem 10/29 >> 10/30 Zosyn 10/29 at [place] 10/29 >>  CTX 10/30>> Flagyl 10/30>>  Microbiology results: 10/30 Ucx: NGTD 10/30 Bcx: 2/2 GCP in clusters  10/30 BCID: MRSA  10/29 MRSA PCR positive   Thank you for allowing pharmacy to be a part of this patient's care.  Onnie Boer; PharmD Candidate 04/10/2019 7:37 AM   I discussed / reviewed the pharmacy note by Onnie Boer, PharmD Candidate and I agree with the student's  findings and plans as documented.  Sloan Leiter, PharmD, BCPS, BCCCP Clinical Pharmacist Clinical phone 04/10/2019 until 10:[phone] Please refer to [facility] for [place] numbers 04/10/2019, 4:18 PM

## 2019-04-10 NOTE — Progress Notes (Addendum)
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  Continue n.p.o. with NG tube to low intermittent wall suction.  Initiating TPN today.  Limit opiates.  Encourage ambulation as able.  Limited treatment options available for pseudocysts at [facility].  Awaiting transfer to the facility in [place] for cystogastrostomy.  Mr. Yapp will need an EGD with esophageal variceal banding and to assess for gastric varices prior to this cyst gastrostomy.  I would assume that the performing gastroenterologist in [place] would prefer to perform the preprocedure EGD to be anatomically familiar with Mr. Kisiel prior to this cyst gastrostomy.  Anemia is overall stable.  There is been no overt bleeding.  The GI service will check back later this week.  Please call with any questions or concerns prior to that time.  Subjective: Overall feeling better.  Is thirsty and wishes that he could drink.  No flatus today although he passed some gas yesterday.  Still no bowel movement.  Some  diffuse abdominal pain responding to pain medications.  No nausea.  No new complaints or concerns today.  Objective:  Vital signs in last 24 hours: Temp:  [98.3 F (36.8 C)-101.9 F (38.8 C)] 98.3 F (36.8 C) (11/02 0758) Pulse Rate:  [113-133] 122 (11/02 1000) Resp:  [16-27] 21 (11/02 1000) BP: (89-152)/(59-87) 111/75 (11/02 1000) SpO2:  [93 %-99 %] 99 % (11/02 1000) Weight:  [85.2 kg] 85.2 kg (11/02 0500) Last BM Date: 04/06/19 General:   Alert, in NAD.  NG tube present.  Poor dentition.  Sclera nonicteric. Heart: Remains tachycardic; no murmurs Pulm: Clear anteriorly; no wheezing Abdomen: Distended with some tympany.  Not tense.  Nontender.  Slightly hypoactive bowel sounds. No rebound or guarding.  No obvious fluid wave.  LAD: No inguinal or umbilical LAD Extremities:  Without edema. Neurologic:  Alert and  oriented x4;  grossly normal neurologically. Psych:  Alert and cooperative.  Spirits are bright.  Normal mood and affect.  Intake/Output from previous day: 11/01 0701 - 11/02 0700 In: 3451.9 [I.V.:1948.8; IV Piggyback:1503.1] Out: 2450 [Urine:1950; Emesis/NG output:500] Intake/Output this shift: Total I/O In: 830.3 [I.V.:229.7; IV Piggyback:600.5] Out: 525 [Urine:525]  Lab Results: Recent Labs    04/07/19 1329 04/08/19 1312 04/10/19 0435  WBC 10.4 7.4 8.5  HGB 7.6* 7.5* 7.2*  HCT 23.8* 24.0* 23.2*  PLT 175 150 215   BMET Recent Labs    04/08/19 1312 04/09/19 0607 04/10/19 0435  NA 130* 131* 135  K 3.7 3.2* 3.2*  CL 99 101 106  CO2 22 19* 22  GLUCOSE 90 115* 112*  BUN 8 5* <5*  CREATININE 0.66 0.65 0.61  CALCIUM 7.5* 7.3* 7.4*   LFT Recent Labs    04/10/19 0435  PROT 5.7*  ALBUMIN 1.4*  AST 36  ALT 38  ALKPHOS 81  BILITOT 0.8   PT/INR No results for input(s): LABPROT, INR in the last 72 hours. Hepatitis Panel No results for input(s): HEPBSAG, HCVAB, HEPAIGM, HEPBIGM in the last 72 hours.  Ct Abdomen Pelvis W Wo Contrast  Result Date:  04/08/2019 CLINICAL DATA:  Acute pancreatitis. Fever and leukocytosis. Sepsis. Cirrhosis. EXAM: CT ABDOMEN AND PELVIS WITHOUT AND WITH CONTRAST TECHNIQUE: Multidetector CT imaging of the abdomen and pelvis was performed following the standard protocol before and following the bolus administration of intravenous contrast. CONTRAST:  14mL OMNIPAQUE IOHEXOL 300 MG/ML  SOLN COMPARISON:  None. FINDINGS: Lower Chest: Small right pleural effusion and dependent bibasilar atelectasis. Hepatobiliary: Hepatic cirrhosis is demonstrated. Tiny cysts seen in central right hepatic lobe. No liver masses identified. Portal and hepatic veins are patent. Recanalization of paraumbilical veins is consistent with portal venous hypertension. Pancreas: Mild-to-moderate acute pancreatitis is seen. No evidence of pancreatic mass or calcifications. A rim enhancing fluid collection is seen along the superior and anterior portion of the pancreas which measures 9.8 x 6.2 cm. A smaller rim enhancing fluid collection is seen along the posterior aspect of the pancreatic head which measures 5.6 x 3.5 cm. A smaller 2 cm rim enhancing fluid collection is seen along the posterior aspect of the pancreatic tail. These are consistent with pancreatic pseudocysts. Spleen: Mild splenomegaly measuring approximately 14 cm in length, consistent with portal venous hypertension. No evidence of splenic vein thrombosis. Portosystemic venous collaterals noted in the left upper quadrant, consistent with portal venous hypertension. Multiple small ill-defined less than 1 cm low-attenuation lesions are seen in the posterior aspect of the spleen, which are nonspecific. Differential considerations include small splenic infarcts and micro abscesses. Adrenals/Urinary Tract: No masses identified. No evidence of hydronephrosis. Stomach/Bowel: Nasogastric tube tip in body of stomach. No evidence of obstruction, inflammatory process or abnormal fluid collections.  Vascular/Lymphatic: Shotty lymph nodes measuring up to 1 cm are seen in the retroperitoneum and central small bowel mesentery, likely reactive in etiology. No pseudoaneurysms visualized. Aortic atherosclerosis. No evidence of abdominal aortic aneurysm. Reproductive:  No mass or other significant abnormality. Other:  Mild abdominal ascites. Musculoskeletal:  No suspicious bone lesions identified. IMPRESSION: Mild-to-moderate acute pancreatitis. Several pancreatic pseudocysts, largest measuring 9.8 cm. No evidence of pseudoaneurysm or other complication. 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 No evidence of hepatic neoplasm. Multiple sub-cm low-attenuation lesions in the posterior spleen which are nonspecific. Differential diagnosis includes small splenic infarcts and micro abscesses. a Shotty mesenteric and retroperitoneal lymphadenopathy, likely reactive in etiology. Recommend continued attention on follow-up imaging. Small right pleural effusion and dependent bibasilar atelectasis. Aortic Atherosclerosis (ICD10-I70.0). Electronically Signed   By: Marlaine Hind M.D.   On: 04/08/2019 13:16      LOS: 4 days   [place]  04/10/2019, 10:34 AM

## 2019-04-11 LAB — GLUCOSE, CAPILLARY
Glucose-Capillary: 100 mg/dL — ABNORMAL HIGH (ref 70–99)
Glucose-Capillary: 101 mg/dL — ABNORMAL HIGH (ref 70–99)
Glucose-Capillary: 90 mg/dL (ref 70–99)
Glucose-Capillary: 95 mg/dL (ref 70–99)

## 2019-04-11 LAB — PHOSPHORUS: Phosphorus: 2.3 mg/dL — ABNORMAL LOW (ref 2.5–4.6)

## 2019-04-11 LAB — BASIC METABOLIC PANEL
Anion gap: 13 (ref 5–15)
BUN: <5 mg/dL — ABNORMAL LOW (ref 6–20)
CO2: 19 mmol/L — ABNORMAL LOW (ref 22–32)
Calcium: 7.5 mg/dL — ABNORMAL LOW (ref 8.9–10.3)
Chloride: 103 mmol/L (ref 98–111)
Creatinine, Ser: 0.66 mg/dL (ref 0.61–1.24)
GFR calc Af Amer: >60 mL/min (ref >60)
GFR calc non Af Amer: >60 mL/min (ref >60)
Glucose, Bld: 101 mg/dL — ABNORMAL HIGH (ref 70–99)
Potassium: 3.6 mmol/L (ref 3.5–5.1)
Sodium: 135 mmol/L (ref 135–145)

## 2019-04-11 LAB — MAGNESIUM: Magnesium: 1.5 mg/dL — ABNORMAL LOW (ref 1.7–2.4)

## 2019-04-11 MED ORDER — MAGNESIUM SULFATE 4 GM/100ML IV SOLN
4.0000 g | Freq: Once | INTRAVENOUS | Status: AC
  Administered 2019-04-11: 11:00:00 4 g via INTRAVENOUS
  Filled 2019-04-11: qty 100

## 2019-04-11 MED ORDER — DEXTROSE-NACL 5-0.9 % IV SOLN
INTRAVENOUS | Status: AC
  Administered 2019-04-11: 11:00:00 via INTRAVENOUS

## 2019-04-11 MED ORDER — POTASSIUM PHOSPHATES 15 MMOLE/5ML IV SOLN
20.0000 mmol | Freq: Once | INTRAVENOUS | Status: AC
  Administered 2019-04-11: 20 mmol via INTRAVENOUS
  Filled 2019-04-11: qty 6.67

## 2019-04-11 NOTE — TOC Progression Note (Addendum)
Transition of Care [facility]) - Progression Note    Patient Details  Name: Charles Calhoun MRN: [id number] Date of Birth: 28-Feb-1971  Transition of [facility]) CM/SW Contact  Zenon Mayo, RN Phone Number: 04/11/2019, 9:59 AM  Clinical Narrative:    Patient transferred from 33M he is on chonic TPN and ID is following for iv abx, patient is from [place] in [place].  Patient states he is going back to [place] today, states his girlfriend will transport him.  He has medicaid of [place] and his meds will be covered in [place] with his medicaid.  The procedure that he is suppose to have is not covered here under his insurance so he will be going back to [place] today.  He states he has two bags of TPN in his frigerator and the RNs with [facility] is working with him.  His TPN is delivered to his home by [facility].  TOC  Team will follow for dc needs.        Expected Discharge Plan and Services                                                 Social Determinants of Health (SDOH) Interventions    Readmission Risk Interventions No flowsheet data found.

## 2019-04-11 NOTE — Progress Notes (Signed)
NAME:  Charles Calhoun, MRN:  [id number], DOB:  June 29, 1970, LOS: 5 ADMISSION DATE:  04/06/2019, CONSULTATION DATE:  04/06/19 REFERRING MD:  Barnabas Harries, CHIEF COMPLAINT:  Abdominal pain  Brief History   48 yo M transferred from [facility] to [facility] MICU for higher level of care in setting of suspected sepsis, enlarging pancreatic pseudocysts.   History of present illness   48 yo M PMH EtOH abuse, Pancreatitis, who presented to [facility] with CC SOB. SOB began 2 days prior to presentation, onset gradually, and with associated symptoms of anorexia, abdominal pain, nausea, weakness and fever. Pain is dull in quality and moderate. No alleviating factors. Patient denies chest pain, vomiting, sick contacts, diarrhea, constipation.  CT abdomen concerning for acute pancreatitis and interval increase in size of pancreatic pseudocysts. Of note, patient with acute pancreatitis 9/26 for which he is receiving TPN via R PICC.  Glu 597, K 5.3, Ca 6.9, Sodium 112 AST 419 ALT 178 Alk phos 146 TBili 2.4  WBC 22,  Hgb  7.5 Lactic acid 6.0  PCT 15.85, BNP 142  Received vanc, zosyn at [facility]. Evaluated by surgeon who determined patient required higher level of care for medical management. Received 2 PRBC for acute anemia. Patient transferred to [facility] MICU.   Past Medical History  EtOH abuse- last drink 2 months ago Pancreatitis Pancreatic pseudocyst  DM with DKA   Significant Hospital Events   10/29 transferred from [facility] to [facility] MICU.  Consults:  IR GI ID  Procedures:  PICC removed 10/30  Significant Diagnostic Tests:   10/29 CXR> R PICC. No acute pulmonary abnormalities   10/29 CT [facility] enlarging pancreatic pseudocysts (10.3 x 6.7 cm from 7.8 x 6cm) with new fluid collection along lesser curvature of stomach. Second collection near posterior main portal vein 5.6cm (previously 5.1cm) Localized mass effect on gastric antrum with fluid in stomach and esophagus-- suspicious for developing  gastric outlet obstruction. Increased peripancreatic stranding concerning for acute pancreatitis. Mild ascites.   Micro Data:  10/29 SARS Cov2> negative (performed at [facility])  04/06/2019 blood cultures-MRSA 10/31 blood cultures >>  Antimicrobials:  10/29 Vanc 10/29 Meropenem x 1 dose  Ceftriaxone 10/30>>  Interim history/subjective:  No sig change. Waiting for tx to [place]. No c/o.   Objective   Blood pressure 103/73, pulse (!) 124, temperature 98.6 F (37 C), temperature source Oral, resp. rate 16, height 6' (1.829 m), weight 85.1 kg, SpO2 99 %.        Intake/Output Summary (Last 24 hours) at 04/11/2019 1129 Last data filed at 04/11/2019 1050 Gross per 24 hour  Intake 2053.34 ml  Output 1750 ml  Net 303.34 ml   Filed Weights   04/10/19 0500 04/10/19 2058 04/11/19 0527  Weight: 85.2 kg 85.1 kg 85.1 kg    Examination: General: pleasant chronically ill appearing male, NAD in bed  HENT: [place]/AT, eyes anicteric  neck: no JVD CV:  tachycardic, regular rhythm, no murmurs.  Sinus rhythm on telemetry Lungs: resps even non labored on RA  ABD:  Protuberant abdomen, mildly distended and tympanic to percussion.  Mildly tender to palpation.  Active bowel sounds.  Copious NGT drainage. 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 Has been on TPN out pt  since September 2020 s/p d/c from the hospital in [place] for treatment of same.   Sepsis Resolved. Due to S.aureus bacteremia likely from  infected pancreatic pseudocyst or PICC line (now removed).  PICC line infection would not account for abdominal pain and ileus, suggesting that pancreas is the source.  Transthoracic echocardiogram without evidence of  endocarditis.  Repeat blood cultures are negative.  PLAN -  -Continue Vanco, ceftriaxone, metronidazole -Continue to follow cultures -ID following  - GI following -- Awaiting tx back to [place] to [facility] for further f/u care and procedures   Ileus -Continue n.p.o. and NG tube to low intermittent wall suction -Limit opiates as possible   Mild hypoglycemia -Continue dextrose and IV fluids (LR) -Continue Accu-Cheks every 4 hours  -Goal BG 140-180 while admitted if requiring insulin  Hx EtOH abuse; last drink 2 months ago - Off TPN since central access removed - IV thiamine - Will likely require TPN again to until pseudocyst is drained.   Hyponatremia-slowly improving.  Clinically euvolemic. -Continue monitoring daily - gentle IV hydration  -Continue monitor I/O  Hypokalemia -continue to monitor - replete Mg Phos   Non-anion gap metabolic acidosis, possibly due to hyperalimentation -continue  to monitor  Acute on chronic anemia; drop overnight does not appear dilutional -Serial CBCs   Sinus tachycardia due to critical illness-improving -Continue to monitor on telemetry   Awaiting tx to [facility] in [place].  Will ask TRH to assume care if unable to tx today.    Best practice:  Diet: TPN per pharmacy- on hold Pain/Anxiety/Delirium protocol (if indicated): PRN dialudid VAP protocol (if indicated): na DVT prophylaxis: SCD; due to hemoglobin drop chemical DVT prophylaxis is contraindicated currently GI prophylaxis: PPI Glucose control: Dextrose containing fluids  mobility: Up to chair Code Status: Full  Family Communication: patient updated at bedside  Disposition: Stepdown to progressive care  Labs   CBC: Recent Labs  Lab 04/06/19 1840 04/07/19 0420 04/07/19 1329 04/08/19 1312 04/10/19 0435 04/10/19 1832  WBC 22.0* 11.3* 10.4 7.4 8.5 10.0  NEUTROABS 20.7* 9.2*  --   --  6.0  --   HGB 8.8* 7.1* 7.6* 7.5* 7.2* 7.3*  HCT 27.1* 22.6* 23.8* 24.0* 23.2* 24.1*  MCV  76.1* 76.6* 76.5* 76.9* 77.6* 78.8*  PLT 191 181 175 150 215 122    Basic Metabolic Panel: Recent Labs  Lab 04/06/19 1830 04/07/19 0421  04/08/19 0111 04/08/19 1312 04/09/19 0607 04/10/19 0435 04/11/19 0823  NA 121* 129*   < > 128* 130* 131* 135 135  K 3.1* 2.9*   < > 4.1 3.7 3.2* 3.2* 3.6  CL 92* 103   < > 99 99 101 106 103  CO2 15* 17*   < > 21* 22 19* 22 19*  GLUCOSE 253* 136*   < > 109* 90 115* 112* 101*  BUN 24* 17   < > 10 8 5* <5* <5*  CREATININE 1.16 0.72   < > 0.71 0.66 0.65 0.61 0.66  CALCIUM 7.6* 6.8*   < > 7.5* 7.5* 7.3* 7.4* 7.5*  MG 1.6* 1.6*  --  2.0  --   --  1.5* 1.5*  PHOS 3.1 1.7*  --   --  2.1*  --  2.5 2.3*   < > = values in this interval not displayed.   GFR: Estimated Creatinine Clearance: 121.3 mL/min (by C-G formula based on SCr of 0.66 mg/dL). Recent Labs  Lab 04/06/19 1830  04/07/19 1329 04/08/19 1312 04/10/19 0435 04/10/19 1832  PROCALCITON 31.27  --   --   --   --   --   WBC  --    < > 10.4 7.4 8.5 10.0   < > = values in this interval not displayed.    Liver Function Tests: Recent Labs  Lab 04/06/19 1830 04/07/19 0421 04/09/19 0607 04/10/19 0435  AST 203* 107* 53* 36  ALT 136* 90* 53* 38  ALKPHOS 109 82 81 81  BILITOT 2.2* 1.5* 1.1 0.8  PROT 6.6 5.3* 5.6* 5.7*  ALBUMIN 1.6* 1.3* 1.3* 1.4*   Recent Labs  Lab 04/06/19 1830  LIPASE 52*  AMYLASE 180*   No results for input(s): AMMONIA in the last 168 hours.  ABG No results found for: PHART, PCO2ART, PO2ART, HCO3, TCO2, ACIDBASEDEF, O2SAT   Coagulation Profile: No results for input(s): INR, PROTIME in the last 168 hours.  Cardiac Enzymes: No results for input(s): CKTOTAL, CKMB, CKMBINDEX, TROPONINI in the last 168 hours.  HbA1C: Hgb A1c MFr Bld  Date/Time Value Ref Range Status  04/06/2019 06:40 PM 5.8 (H) 4.8 - 5.6 % Final    Comment:    (NOTE) Pre diabetes:          5.7%-6.4% Diabetes:              >6.4% Glycemic control for   <7.0% adults with diabetes      CBG: Recent Labs  Lab 04/10/19 1450 04/10/19 1949 04/11/19 0013 04/11/19 0410 04/11/19 0745  GLUCAP 84 100* 95 101* 90    Katy Whiteheart, NP 04/11/2019  11:29 AM Pager: [phone]

## 2019-04-11 NOTE — Progress Notes (Addendum)
PHARMACY - ADULT TOTAL PARENTERAL NUTRITION CONSULT NOTE  Pharmacy Consult:  TPN Indication:  Pancreatic pseudocyst with GOO  Patient Measurements: Height: 6' (182.9 cm) Weight: 187 lb 9.6 oz (85.1 kg)(scale c) IBW/kg (Calculated) : 77.6 TPN AdjBW (KG): 85.1 Body mass index is 25.44 kg/m.  Assessment:  26 YOM presented with abdominal pain and vomiting. He has a history of EtOH abuse and cirrhosis.  Found to have pancreatic pseudocyst, decision was made to treat patient conservatively with NPO status and TPN for 6 weeks. TPN was initiated on 03/04/19 with potential end date of 04/15/19. Patient's care has been at [facility] in [place], [place]. Patient was transferred to [facility] on 10/29 after becoming septic and finding that the pseudocysts have become enlarged.  GI: prealbumin < 5, NG O/P 81mL.  PPI IV BID Endo: no hx DM - CBGs low normal Insulin requirements in the past 24 hours: 0 unit SSI Lytes: Mag 1.5 post 2gm, Phos 2.3, others WNL Renal: SCr 0.66, BUN WNL - UOP 0.8 ml/kg/hr, D5W at 50 ml/hr + LR at 30 ml/hr, net +7.1L Pulm: stable on RA Cards: BP controlled, tachy 120-140s Hepatobil: LFTs / tbili / TG WNL Neuro: A&O - thiamine, PRN Dilaudid ID: Vanc/CTX/Flagyl for MRSA bacteremia + IAI.  Afebrile, WBC WNL. TPN Access: PICC pulled for bacteremia, will need new PICC prior to restarting TPN TPN start date: TBD  Nutritional Goals (per RD rec on 10/30): 2500-2700 kCal, 125-135gm protein, >2.4L fluid per day  Home TPN formula: [facility] in [place] [phone] 1886 kCal, 100gm protein (CHO 290g, ILE 50g), 2233mL over 12 hrs, thiamine 100mg   Current Nutrition:  NPO  Plan:  F/u central access to restart TPN Hold moderate SSI Q4H while NPO.  CBG check Q12H to monitor for hypoglycemia. Change D5W to D5NS, continue at 50 ml/hr as previously ordered KPhos 20 mmol IV x 1 Mag sulfate 4gm IV x 1 F/U Mag and Phos in AM Consider stopping LR at 30 ml/hr  Alisse Tuite D. Mina Marble, PharmD, BCPS,  [place] 04/11/2019, 9:30 AM

## 2019-04-12 ENCOUNTER — Inpatient Hospital Stay (HOSPITAL_COMMUNITY): Payer: Non-veteran care

## 2019-04-12 ENCOUNTER — Inpatient Hospital Stay: Payer: Self-pay

## 2019-04-12 LAB — CBC
HCT: 24 % — ABNORMAL LOW (ref 39.0–52.0)
Hemoglobin: 7.2 g/dL — ABNORMAL LOW (ref 13.0–17.0)
MCH: 23.8 pg — ABNORMAL LOW (ref 26.0–34.0)
MCHC: 30 g/dL (ref 30.0–36.0)
MCV: 79.2 fL — ABNORMAL LOW (ref 80.0–100.0)
Platelets: 475 10*3/uL — ABNORMAL HIGH (ref 150–400)
RBC: 3.03 MIL/uL — ABNORMAL LOW (ref 4.22–5.81)
RDW: 20.8 % — ABNORMAL HIGH (ref 11.5–15.5)
WBC: 9.1 10*3/uL (ref 4.0–10.5)
nRBC: 0 % (ref 0.0–0.2)

## 2019-04-12 LAB — GLUCOSE, CAPILLARY
Glucose-Capillary: 89 mg/dL (ref 70–99)
Glucose-Capillary: 99 mg/dL (ref 70–99)

## 2019-04-12 LAB — PHOSPHORUS: Phosphorus: 2.7 mg/dL (ref 2.5–4.6)

## 2019-04-12 LAB — MAGNESIUM: Magnesium: 1.5 mg/dL — ABNORMAL LOW (ref 1.7–2.4)

## 2019-04-12 MED ORDER — INSULIN ASPART 100 UNIT/ML ~~LOC~~ SOLN
0.0000 [IU] | Freq: Four times a day (QID) | SUBCUTANEOUS | Status: DC
  Administered 2019-04-13: 1 [IU] via SUBCUTANEOUS
  Administered 2019-04-14: 2 [IU] via SUBCUTANEOUS
  Administered 2019-04-14 – 2019-04-17 (×10): 1 [IU] via SUBCUTANEOUS
  Administered 2019-04-18: 2 [IU] via SUBCUTANEOUS

## 2019-04-12 MED ORDER — SODIUM CHLORIDE 0.9% FLUSH
10.0000 mL | Freq: Two times a day (BID) | INTRAVENOUS | Status: DC
  Administered 2019-04-15 – 2019-04-17 (×3): 10 mL
  Administered 2019-04-17 – 2019-04-18 (×2): 20 mL
  Administered 2019-04-18 – 2019-04-24 (×8): 10 mL

## 2019-04-12 MED ORDER — CHLORHEXIDINE GLUCONATE CLOTH 2 % EX PADS
6.0000 | MEDICATED_PAD | Freq: Every day | CUTANEOUS | Status: DC
  Administered 2019-04-12 – 2019-04-19 (×8): 6 via TOPICAL

## 2019-04-12 MED ORDER — TRAVASOL 10 % IV SOLN
INTRAVENOUS | Status: AC
  Administered 2019-04-12 (×2): via INTRAVENOUS
  Filled 2019-04-12: qty 403.2

## 2019-04-12 MED ORDER — DEXTROSE-NACL 5-0.9 % IV SOLN: INTRAVENOUS | Status: AC

## 2019-04-12 MED ORDER — SODIUM CHLORIDE 0.9% FLUSH: 10.0000 mL | INTRAVENOUS | Status: DC | PRN

## 2019-04-12 MED ORDER — MAGNESIUM SULFATE 50 % IJ SOLN
6.0000 g | Freq: Once | INTRAVENOUS | Status: AC
  Administered 2019-04-12: 11:00:00 6 g via INTRAVENOUS
  Filled 2019-04-12: qty 12

## 2019-04-12 NOTE — Progress Notes (Signed)
Received order for PICC  

## 2019-04-12 NOTE — Progress Notes (Signed)
Nutrition Follow-up  DOCUMENTATION CODES:   Not applicable  INTERVENTION:   -TPN management per pharmacy   NUTRITION DIAGNOSIS:   Increased nutrient needs related to acute illness as evidenced by estimated needs.  Ongoing  GOAL:   Patient will meet greater than or equal to 90% of their needs  Progressing   MONITOR:   Labs, Weight trends, I & O's  REASON FOR ASSESSMENT:   Consult New TPN/TNA  ASSESSMENT:   48 yo male admitted from [facility] with sepsis, enlarging pancreatic pseudocysts. PMH includes alcoholic pancreatitis, DM, pancreatic pseudocyst (receiving home TPN since 03/04/2019).  10/31- PICC pulled due to bacteremia 11/2- NGT d/c  11/3- transferred from ICU to PCU 11/4- PICC placed, TPN re-started  IR consulted for drainage of pseudocysts, however, not amendable to perc drainage. IR recommends GI consult for possible gastrocystostomy.  Per MD notes, sepsis has been resolved. Plan for PICC replacement and re-starting TPN.   PTA, pt was receiving TPN from [facility] in [place]. Home regimen infused 2250 ml x 12 hours and provided 1886 kcals and 100 grams protein, which met 75% of estimated kcal needs and 80% of estimated protein needs.   Per pharmacy note, plan to initiate TPN at 30 ml/hr, which provides 790 kcals and 40 grams protein, meeting 32% of estimated kcal and protein needs. Goal rate for TPN 95 ml/hr.   Plan to continue IV antibiotics. Pt is awaiting transfer back to [facility] in [place].   Labs reviewed: Phos: 2.3, Mg: 1.5 (being repleted via IV), CBGS: 89-100 (inpatient orders for glycemic control are 0-9 units insulin aspart every 6 hours).   Diet Order:   Diet Order            Diet NPO time specified  Diet effective now              EDUCATION NEEDS:   Not appropriate for education at this time  Skin:  Skin Assessment: Reviewed RN Assessment  Last BM:  04/11/19  Height:   Ht Readings from Last 1 Encounters:  04/10/19  6' (1.829 m)    Weight:   Wt Readings from Last 1 Encounters:  04/12/19 84.9 kg    Ideal Body Weight:  80.9 kg  BMI:  Body mass index is 25.39 kg/m.  Estimated Nutritional Needs:   Kcal:  2500-2700  Protein:  125-135 gm  Fluid:  >/= 2.4 L    Patte Winkel A. Jimmye Norman, RD, LDN, [place] Registered Dietitian II Certified Diabetes Care and Education Specialist Pager: [phone] After hours Pager: [phone]

## 2019-04-12 NOTE — Progress Notes (Signed)
PROGRESS NOTE    Charles Calhoun  PJK:[id number] DOB: 1970/11/17 DOA: 04/06/2019 PCP: No primary care provider on file.    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  Patient admitted here with sepsis due to staph aureus bacteremia likely from infected pancreatic pseudocyst versus infected PICC line.  The PICC line has since been removed.  The patient has been on IV antibiotics and is being followed by ID and GI as we await transfer back to the patient's primary GI doctors at the [facility] in [place].   Assessment & Plan:   Sepsis Resolved. Due to S.aureus bacteremia likely from  infected pancreatic pseudocyst or PICC line (now removed).  PICC line infection would not account for abdominal pain and ileus, suggesting that pancreas is the source.  Transthoracic echocardiogram without evidence of endocarditis.  Repeat blood cultures are negative.  PLAN -  -Continue Vanco, ceftriaxone, metronidazole -Continue to follow cultures; repeat cultures are negative thus will order for a new PICC line and restart TPN today -ID following  --GI following -- Awaiting tx back to [place] to [facility] for further f/u care and procedures   Ileus -Continue n.p.o. and NG tube to low intermittent wall suction -Limit opiates as possible  Mild hypoglycemia -Continue dextrose and IV fluids (LR) -Continue Accu-Cheks every 4 hours  -Goal BG 140-180 while admitted if requiring insulin  Hx EtOH abuse; last drink 2 months ago - Restart TPN today after placement of new PICC line - IV thiamine - Will likely require TPN until pseudocyst is drained.   Hyponatremia-slowly improving.  Clinically euvolemic. -Continue monitoring daily - gentle IV hydration  -Continue monitor I/O  Hypokalemia;  improved -continue to monitor - replete Mg Phos   Non-anion gap metabolic acidosis, possibly due to hyperalimentation -continue  to monitor  Acute on chronic anemia; drop overnight does not appear dilutional -Serial CBCs   Sinus tachycardia due to critical illness-improving -Continue to monitor on telemetry   DVT prophylaxis: Lovenox SQ Code Status:Full code   Consultants:   ID and GI  Procedures:   PICC line placement  Antimicrobials:  IV ceftriaxone, metronidazole and vancomycin   Subjective: Patient states that he feels his abdominal distention is slightly improved today and endorses flatus as well as a bowel movement.  He denies fevers, chills, shortness of breath.  Objective: Vitals:   04/12/19 0227 04/12/19 0511 04/12/19 0745 04/12/19 1124  BP:  114/87 109/90 125/88  Pulse:  (!) 123 (!) 122 (!) 124  Resp:  18 20 20   Temp:  98.4 F (36.9 C) 98.6 F (37 C) 98.5 F (36.9 C)  TempSrc:  Oral Oral Oral  SpO2:  98% 100% 98%  Weight: 84.9 kg     Height:        Intake/Output Summary (Last 24 hours) at 04/12/2019 1431 Last data filed at 04/12/2019 0901 Gross per 24 hour  Intake 1917.39 ml  Output 2171 ml  Net -253.61 ml   Filed Weights   04/10/19 2058 04/11/19 0527 04/12/19 0227  Weight: 85.1 kg 85.1 kg 84.9 kg    Examination:  General: pleasant chronically ill appearing male, NAD in bed  HENT: [place]/AT, eyes anicteric  neck: no JVD CV: tachycardic, regular rhythm, no murmurs.  Sinus rhythm on telemetry Lungs: resps even non labored on RA  ABD:  Protuberant abdomen, mildly distended and tympanic to percussion.    Minimally tender to palpation.  Active bowel sounds.  Copious NGT drainage. EXT: No clubbing, cyanosis, edema Skin: No rashes or wounds Neuro: awake, alert, appropriate  Psych: Cooperative, normal behavior    LOS: 6 days    Time spent: 35 mins   Rae Halsted, MD [facility] Pager [phone]  If 7PM-7AM, please  contact night-coverage [email or web address] Password [facility] 04/12/2019, 2:31 PM

## 2019-04-12 NOTE — Progress Notes (Signed)
Peripherally Inserted Central Catheter/Midline Placement  The IV Nurse has discussed with the patient and/or persons authorized to consent for the patient, the purpose of this procedure and the potential benefits and risks involved with this procedure.  The benefits include less needle sticks, lab draws from the catheter, and the patient may be discharged home with the catheter. Risks include, but not limited to, infection, bleeding, blood clot (thrombus formation), and puncture of an artery; nerve damage and irregular heartbeat and possibility to perform a PICC exchange if needed/ordered by physician.  Alternatives to this procedure were also discussed.  Bard Power PICC patient education guide, fact sheet on infection prevention and patient information card has been provided to patient /or left at bedside.    PICC/Midline Placement Documentation  PICC Double Lumen 04/12/19 PICC Right Brachial 41 cm 0 cm (Active)  Indication for Insertion or Continuance of Line Administration of hyperosmolar/irritating solutions (i.e. TPN, Vancomycin, etc.) 04/12/19 1255  Exposed Catheter (cm) 2 cm 04/12/19 1255  Site Assessment Clean;Dry;Intact 04/12/19 1255  Lumen #1 Status Flushed;Blood return noted;Saline locked 04/12/19 1255  Lumen #2 Status Flushed;Blood return noted;Saline locked 04/12/19 1255  Dressing Type Transparent;Securing device 04/12/19 1255  Dressing Status Clean;Dry;Intact;Antimicrobial disc in place 04/12/19 1255  Dressing Change Due 04/19/19 04/12/19 1255       Frances Maywood 04/12/2019, 12:58 PM

## 2019-04-12 NOTE — Progress Notes (Addendum)
PHARMACY - ADULT TOTAL PARENTERAL NUTRITION CONSULT NOTE  Pharmacy Consult:  TPN Indication:  Pancreatic pseudocyst with GOO  Patient Measurements: Height: 6' (182.9 cm) Weight: 187 lb 3.2 oz (84.9 kg) IBW/kg (Calculated) : 77.6 TPN AdjBW (KG): 85.1 Body mass index is 25.39 kg/m.  Assessment:  73 YOM presented with abdominal pain and vomiting. He has a history of EtOH abuse and cirrhosis.  Found to have pancreatic pseudocyst, decision was made to treat patient conservatively with NPO status and TPN for 6 weeks. TPN was initiated on 03/04/19 with potential end date of 04/15/19. Patient's care has been at [facility] in [place], [place]. Patient was transferred to [facility] on 10/29 after becoming septic and finding that the pseudocysts have become enlarged. TPN held for 1 week due to no central line; patient has been on D5W 50 ml/hr while TPN was held, is at lower risk for refeeding.  GI: prealbumin < 5, NG O/P 48mL.  PPI IV BID Endo: no hx DM - CBGs low normal Insulin requirements in the past 24 hours: 0 unit SSI Lytes: Mag still 1.5 post 4gm, Ca 7.5> Corrected = 9.6 Renal: SCr 0.66, BUN low- UOP 1 ml/kg/hr, D5WNS at 50 ml/hr > line infiltrated so was stopped by RN; LR at 30 ml/hr is running; net +6.2L Pulm: stable on RA Cards: BP controlled, tachy 120-130s Hepatobil: LFTs / tbili / TG WNL Neuro: A&O - thiamine, PRN Dilaudid ID: Vanc/CTX/Flagyl for MRSA bacteremia + IAI.  Afebrile, WBC WNL. TPN Access: PICC pulled for bacteremia, will need new PICC prior to restarting TPN TPN start date: TBD  Nutritional Goals (per RD rec on 10/30): 2500-2700 kCal, 125-135gm protein, >2.4L fluid per day  Home TPN formula: [facility] in [place] [phone] kCal, 100gm protein (CHO 290g, ILE 50g), 2281mL over 12 hrs, thiamine 100mg   Current Nutrition:  NPO  Plan:  Place central access today and restart TPN Start at TPN 71ml/hr (goal 20ml/hr) TPN will provide 40g AA, 115g dextrose, 24g lipids; 790  kcal Electrolytes in TPN increase Mg, Na, K, phos, acetate   Daily MVI, TE, folate and thiamine in TPN Restart low SSI Q6H  Decrease D5NS to 20 ml/hr  Mag sulfate 6gm IV x 1 F/U AM labs  Ok to DC LR per MD   Benetta Spar, PharmD, BCPS, [facility] Clinical Pharmacist  Please check AMION for all [place] phone numbers After 10:00 PM, call [place]

## 2019-04-12 NOTE — Progress Notes (Signed)
VAST consulted to place PICC for pt to receive TPN, however order was placed incorrectly. Called and spoke with pt's nurse to have physician place PICC order correctly so that PICC nurses will see order.

## 2019-04-13 ENCOUNTER — Encounter (HOSPITAL_COMMUNITY): Payer: Self-pay | Admitting: General Practice

## 2019-04-13 ENCOUNTER — Inpatient Hospital Stay (HOSPITAL_COMMUNITY): Payer: Non-veteran care

## 2019-04-13 LAB — COMPREHENSIVE METABOLIC PANEL
ALT: 20 U/L (ref 0–44)
AST: 22 U/L (ref 15–41)
Albumin: 1.4 g/dL — ABNORMAL LOW (ref 3.5–5.0)
Alkaline Phosphatase: 78 U/L (ref 38–126)
Anion gap: 7 (ref 5–15)
BUN: <5 mg/dL — ABNORMAL LOW (ref 6–20)
CO2: 22 mmol/L (ref 22–32)
Calcium: 7.5 mg/dL — ABNORMAL LOW (ref 8.9–10.3)
Chloride: 112 mmol/L — ABNORMAL HIGH (ref 98–111)
Creatinine, Ser: 0.72 mg/dL (ref 0.61–1.24)
GFR calc Af Amer: >60 mL/min (ref >60)
GFR calc non Af Amer: >60 mL/min (ref >60)
Glucose, Bld: 119 mg/dL — ABNORMAL HIGH (ref 70–99)
Potassium: 3.3 mmol/L — ABNORMAL LOW (ref 3.5–5.1)
Sodium: 141 mmol/L (ref 135–145)
Total Bilirubin: 0.7 mg/dL (ref 0.3–1.2)
Total Protein: 6.2 g/dL — ABNORMAL LOW (ref 6.5–8.1)

## 2019-04-13 LAB — OCCULT BLOOD X 1 CARD TO LAB, STOOL: Fecal Occult Bld: POSITIVE — AB

## 2019-04-13 LAB — CULTURE, BLOOD (ROUTINE X 2)
Culture: NO GROWTH
Culture: NO GROWTH
Special Requests: ADEQUATE

## 2019-04-13 LAB — T4, FREE: Free T4: 1.4 ng/dL — ABNORMAL HIGH (ref 0.61–1.12)

## 2019-04-13 LAB — PHOSPHORUS: Phosphorus: 3.2 mg/dL (ref 2.5–4.6)

## 2019-04-13 LAB — GLUCOSE, CAPILLARY
Glucose-Capillary: 103 mg/dL — ABNORMAL HIGH (ref 70–99)
Glucose-Capillary: 104 mg/dL — ABNORMAL HIGH (ref 70–99)
Glucose-Capillary: 106 mg/dL — ABNORMAL HIGH (ref 70–99)
Glucose-Capillary: 120 mg/dL — ABNORMAL HIGH (ref 70–99)
Glucose-Capillary: 126 mg/dL — ABNORMAL HIGH (ref 70–99)

## 2019-04-13 LAB — MAGNESIUM: Magnesium: 1.5 mg/dL — ABNORMAL LOW (ref 1.7–2.4)

## 2019-04-13 LAB — TRIGLYCERIDES: Triglycerides: 77 mg/dL (ref <150)

## 2019-04-13 LAB — SARS CORONAVIRUS 2 (TAT 6-24 HRS): SARS Coronavirus 2: NEGATIVE

## 2019-04-13 MED ORDER — METOPROLOL SUCCINATE ER 25 MG PO TB24
25.0000 mg | ORAL_TABLET | Freq: Every day | ORAL | Status: DC
  Administered 2019-04-13 – 2019-04-20 (×8): 25 mg via ORAL
  Filled 2019-04-13 (×8): qty 1

## 2019-04-13 MED ORDER — MAGNESIUM SULFATE 50 % IJ SOLN
6.0000 g | Freq: Once | INTRAVENOUS | Status: DC
  Filled 2019-04-13: qty 12

## 2019-04-13 MED ORDER — CHLORHEXIDINE GLUCONATE CLOTH 2 % EX PADS
6.0000 | MEDICATED_PAD | Freq: Every day | CUTANEOUS | Status: DC
  Administered 2019-04-15 – 2019-04-24 (×7): 6 via TOPICAL

## 2019-04-13 MED ORDER — TRAVASOL 10 % IV SOLN
INTRAVENOUS | Status: AC
  Administered 2019-04-13: 18:00:00 via INTRAVENOUS
  Filled 2019-04-13: qty 806.4

## 2019-04-13 MED ORDER — MUPIROCIN 2 % EX OINT
TOPICAL_OINTMENT | Freq: Two times a day (BID) | CUTANEOUS | Status: DC
  Administered 2019-04-13 – 2019-04-24 (×23): via NASAL
  Filled 2019-04-13 (×3): qty 22

## 2019-04-13 MED ORDER — MAGNESIUM SULFATE 50 % IJ SOLN
6.0000 g | Freq: Once | INTRAVENOUS | Status: AC
  Administered 2019-04-13: 6 g via INTRAVENOUS
  Filled 2019-04-13: qty 12

## 2019-04-13 MED ORDER — POTASSIUM CHLORIDE 10 MEQ/100ML IV SOLN
10.0000 meq | INTRAVENOUS | Status: AC
  Administered 2019-04-13 (×3): 10 meq via INTRAVENOUS
  Filled 2019-04-13 (×3): qty 100

## 2019-04-13 NOTE — Progress Notes (Signed)
PHARMACY - ADULT TOTAL PARENTERAL NUTRITION CONSULT NOTE  Pharmacy Consult:  TPN Indication:  Pancreatic pseudocyst with GOO  Patient Measurements: Height: 6' (182.9 cm) Weight: 186 lb 6.4 oz (84.6 kg) IBW/kg (Calculated) : 77.6 TPN AdjBW (KG): 85.1 Body mass index is 25.28 kg/m.  Assessment:  61 YOM presented with abdominal pain and vomiting. He has a history of EtOH abuse and cirrhosis.  Found to have pancreatic pseudocyst, decision was made at outside hospital to treat patient conservatively with NPO status and TPN for 6 weeks. TPN was initiated on 03/04/19 with potential end date of 04/15/19. Patient's care has been at [facility] in [place], [place]. Patient was transferred to [facility] on 10/29 after becoming septic and finding that the pseudocysts have become enlarged. TPN held for 1 week due to no central line; patient has been on D5W 50 ml/hr while TPN was held, is at lower risk for refeeding.  GI: prealbumin < 5, NG O/P 591mL.  PPI IV BID Endo: no hx DM - CBGs well controlled  Insulin requirements in the past 24 hours: 1 unit SSI Lytes: Mag still 1.5 post 2-6gm daily since 11/1; K 3.3 > repleted Renal: SCr stable, BUN low- UOP 1.1 ml/kg/hr, D5WNS at 20 ml/hr  Pulm: RA Cards: BP controlled, tachy improving 110-120s on metoprolol Hepatobil: LFTs / tbili WNL, TG 77  Neuro: PRN Dilaudid ID: Vanc/CTX/Flagyl for MRSA bacteremia + IAI. 11/4 BCx neg;  Afebrile, WBC WNL. TPN Access: PICC pulled for bacteremia, new PICC 11/4 TPN start date: 11/4  Nutritional Goals (per RD rec on 10/30): 2500-2700 kCal, 125-135gm protein, >2.4L fluid per day  Home TPN formula: [facility] in [place], 100gm protein (CHO 290g, ILE 50g), 2293mL over 12 hrs, thiamine 100mg   Current Nutrition:  NPO  Plan:  Increase TPN to 60 ml/hr (goal 43ml/hr) TPN will provide 81g AA, 230g dextrose, 47g lipids; 1580 kcal; 68% of Kcal goal Electrolytes in TPN increase Mg, K; decrease phos; change to max acetate    Daily MVI, TE, folate and thiamine in TPN Continue low SSI Q6H; DC if well controlled on min SSI  Stop D5NS 20 ml/hr at 1800 Mg Sulfate 6g x1 outside TPN  KCl IV 40 mEq outside TPN F/U AM labs   Benetta Spar, PharmD, BCPS, [facility] Clinical Pharmacist  Please check AMION for all [place] phone numbers After 10:00 PM, call Between

## 2019-04-13 NOTE — Progress Notes (Signed)
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  Patient admitted here with sepsis due to staph aureus bacteremia likely from infected pancreatic pseudocyst versus infected PICC line.  The PICC line has since been removed.  The patient has been on IV antibiotics and is being followed by ID and GI as we await transfer back to the patient's primary GI doctors at the [facility] in [place].   Assessment & Plan:   Sepsis Resolved. Due to S.aureus bacteremia likely from  infected pancreatic pseudocyst or PICC line (now removed).  PICC line infection would not account for abdominal pain and ileus, suggesting that pancreas is the source.  Transthoracic echocardiogram without evidence of endocarditis.  Repeat blood cultures are negative.  PLAN -  -Continue Vanco, ceftriaxone, metronidazole -Continue to follow cultures -ID following  -GI following -- Awaiting transfer back to [place] to [facility] for further f/u care and procedures   Ileus and gastric outlet obstruction -Likely due to enlarging pseudocyst -Continue n.p.o. and NG tube to low intermittent wall suction -Limit opiates as possible  Mild hypoglycemia -Continue dextrose and IV fluids (LR) -Continue Accu-Cheks every 4 hours  -Goal BG 140-180 while admitted if requiring insulin  Hx EtOH abuse; last drink 2 months ago - Restarted TPN on 04/12/2019 after placement of new PICC line - IV thiamine - Will likely require TPN until pseudocyst is drained.   Hyponatremia-slowly improving.  Clinically euvolemic. -Continue monitoring daily - gentle IV hydration  -Continue monitor I/O  Hypokalemia; improved -continue  to monitor - replete Mg Phos   Non-anion gap metabolic acidosis, possibly due to hyperalimentation -continue  to monitor  Acute on chronic anemia; drop overnight does not appear dilutional -Serial CBCs   Sinus tachycardia due to critical illness-improving -Continue to monitor on telemetry   DVT prophylaxis: Lovenox SQ Code Status:Full code   Consultants:   ID and GI  Procedures:   PICC line placement  Antimicrobials:  IV ceftriaxone, metronidazole and vancomycin   Subjective: Patient states that he feels his abdominal distention is about the same today and endorses flatus as well as a bowel movements.  He denies fevers, chills, shortness of breath.  Objective: Vitals:   04/13/19 0055 04/13/19 0508 04/13/19 0721 04/13/19 1115  BP: (!) 129/97 119/87 128/86 125/89  Pulse: (!) 123 (!) 116 (!) 114 (!) 112  Resp: 18 20 18 16   Temp: 99.1 F (37.3 C) 98.7 F (37.1 C) 98.9 F (37.2 C) 98.4 F (36.9 C)  TempSrc: Oral Oral Oral Oral  SpO2: 99% 99% 99% 100%  Weight:  84.6 kg    Height:        Intake/Output Summary (Last 24 hours) at 04/13/2019 1518 Last data filed at 04/13/2019 1250 Gross per 24 hour  Intake -  Output 2076 ml  Net -2076 ml   Filed Weights   04/11/19 0527 04/12/19 0227 04/13/19 0508  Weight: 85.1 kg 84.9 kg 84.6 kg    Examination:  General: pleasant chronically ill appearing male, NAD in bed  HENT: [place]/AT, eyes anicteric, NG tube in place neck: no JVD CV: tachycardic, regular rhythm, no murmurs.  Sinus rhythm on telemetry Lungs: resps even non labored on RA  ABD:  Protuberant abdomen, mildly distended and tympanic to percussion.    Minimally tender to palpation.  Active bowel sounds.  Copious NGT drainage. EXT: No clubbing, cyanosis, edema Skin: No rashes or wounds Neuro: awake, alert, appropriate  Psych: Cooperative, normal behavior    LOS: 7 days    Time spent: 25 mins   Rae Halsted, MD [facility] Pager  [phone]  If 7PM-7AM, please contact night-coverage [email or web address] Password [facility] 04/13/2019, 3:18 PM

## 2019-04-13 NOTE — Progress Notes (Signed)
Pharmacy Antibiotic Note  Charles Calhoun is a 48 y.o. male admitted on 04/06/2019 as a transfer from [facility] for higher level of care in setting of MRSA bacteremia from PICC line vs. pancreatitis/pancreatic pseudocyst. Pharmacy is consulted for vancomycin. Also continuing on ceftriaxone and Flagyl per ID. S/p central line holiday and repeat BCx remain negative. PICC replaced for TPN access 11/4.  Calculated AUC was within goal on 11/2 for current vancomycin dose. Scr stable, WBC wnl, afebrile.  Plan: Continue vancomycin 1500mg  IV q12h Ceftriaxone 2g IV q24h + Flagyl 500mg  IV q8h per MD Monitor clinical progress, c/s, renal function F/u LOT, vancomycin levels weekly   Height: 6' (182.9 cm) Weight: 186 lb 6.4 oz (84.6 kg) IBW/kg (Calculated) : 77.6  Temp (24hrs), Avg:98.8 F (37.1 C), Min:98.4 F (36.9 C), Max:99.1 F (37.3 C)  Recent Labs  Lab 04/07/19 1329  04/08/19 1312 04/09/19 0607 04/10/19 0435 04/10/19 0814 04/10/19 1449 04/10/19 1832 04/11/19 0823 04/12/19 0356 04/13/19 0608  WBC 10.4  --  7.4  --  8.5  --   --  10.0  --  9.1  --   CREATININE 0.76   < > 0.66 0.65 0.61  --   --   --  0.66  --  0.72  VANCOTROUGH  --   --   --   --   --   --  12*  --   --   --   --   VANCOPEAK  --   --   --   --  11*  --   --   --   --   --   --   VANCORANDOM  --   --   --   --   --  29  --   --   --   --   --    < > = values in this interval not displayed.    Estimated Creatinine Clearance: 121.3 mL/min (by C-G formula based on SCr of 0.72 mg/dL).    No Known Allergies  Antimicrobials this admission: Meropenem 10/29 >> 10/30 Zosyn 10/29 at OSH  Vanc 10/29 >>  CTX 10/30>> Flagyl 10/30>>  11/2:  VT pk 29, VT 12, calc AUC 524 - continue current dose  Microbiology results: 10/31: BC - ngtd OSH: 3/4 BC positive for GPC  10/30 Ucx: neg 10/30 Bcx: MRSA 10/29 MRSA PCR positive   Elicia Lamp, PharmD, BCPS Please check AMION for all [place] contact  numbers Clinical Pharmacist 04/13/2019 9:16 AM

## 2019-04-13 NOTE — Progress Notes (Signed)
Noted that the Pt NGT partially out,  No drainage aspirated. Got an order from NP KYERE to continue NGT.   NGT pushed back, Xray done-verified placement, hooked backed to suction.patient tolerated it well. Will monitor

## 2019-04-13 NOTE — Progress Notes (Signed)
  Per Judson Roch PA , pt's NGT can be clamped for ambulation purposes only.  Pt requested ice chips, per GI note is ok to give.

## 2019-04-13 NOTE — Progress Notes (Signed)
Daily Rounding Note  04/13/2019, 8:54 AM  LOS: 7 days   SUBJECTIVE:   Chief complaint: Some abdominal pain and pressure.  No nausea or vomiting.  Bowel movement last night. NG tube to LIS remains in place.  Got malpositioned and was repositioned/advanced overnight.  100 mL of fluid recorded from NG tube today. Still tachycardic in the 1 teens. Still waiting on a hospital bed in the state of [place]. Wondering if he could have some water to drink.  OBJECTIVE:         Vital signs in last 24 hours:    Temp:  [98.4 F (36.9 C)-99.1 F (37.3 C)] 98.9 F (37.2 C) (11/05 0721) Pulse Rate:  [114-124] 114 (11/05 0721) Resp:  [18-20] 18 (11/05 0721) BP: (119-129)/(83-97) 128/86 (11/05 0721) SpO2:  [97 %-99 %] 99 % (11/05 0721) Weight:  [84.6 kg] 84.6 kg (11/05 0508) Last BM Date: 04/11/19 Filed Weights   04/11/19 0527 04/12/19 0227 04/13/19 0508  Weight: 85.1 kg 84.9 kg 84.6 kg   General: Looks well, alert, comfortable. Heart: RRR. Chest: Clear bilaterally.  No cough or labored breathing. Abdomen: Distended, moderately tense.  Umbilical hernia.  Active bowel sounds.  Minimal tenderness on the left side. Extremities: No CCE. Neuro/Psych: Pleasant.  Calm, alert, oriented x3.  No tremor  Intake/Output from previous day: 11/04 0701 - 11/05 0700 In: -  Out: 2176 [Urine:2175; Stool:1]  Intake/Output this shift: Total I/O In: -  Out: 300 [Urine:200; Emesis/NG output:100]  Lab Results: Recent Labs    04/10/19 1832 04/12/19 0356  WBC 10.0 9.1  HGB 7.3* 7.2*  HCT 24.1* 24.0*  PLT 311 475*   BMET Recent Labs    04/11/19 0823 04/13/19 0608  NA 135 141  K 3.6 3.3*  CL 103 112*  CO2 19* 22  GLUCOSE 101* 119*  BUN <5* <5*  CREATININE 0.66 0.72  CALCIUM 7.5* 7.5*   LFT Recent Labs    04/13/19 0608  PROT 6.2*  ALBUMIN 1.4*  AST 22  ALT 20  ALKPHOS 78  BILITOT 0.7   PT/INR No results for input(s):  LABPROT, INR in the last 72 hours. Hepatitis Panel No results for input(s): HEPBSAG, HCVAB, HEPAIGM, HEPBIGM in the last 72 hours.  Studies/Results: Dg Abd Portable 1v  Result Date: 04/12/2019 CLINICAL DATA:  NG tube placement EXAM: PORTABLE ABDOMEN - 1 VIEW COMPARISON:  04/10/2019 FINDINGS: NG tube tip is in the proximal to mid stomach. IMPRESSION: NG tube tip in the proximal to mid stomach. Electronically Signed   By: Rolm Baptise M.D.   On: 04/12/2019 21:41   [place] Ekg Site Rite  Result Date: 04/12/2019 If Site Rite image not attached, placement could not be confirmed due to current cardiac rhythm.   ASSESMENT:   *    Alcoholic pancreatitis with pseudocyst. Staph bacteremia/sepsis, suspect due to infected ps cyst vs infected PICC.    *    GOO due to mass-effect from enlarging pseudocyst. Awaits transfer to hospital in [place], in network, for cyst gastrostomy.  NGT in place.     *     Ileus requiring NG tube/LIS  *    Microcytic anemia.     *    Cirrhosis of the liver.     *     Portal hypertension, esophageal varices.  EGD (02/11/18): Three columns of varices with high risk features, treated with EVLx2  EGD (04/12/18): No varices, severe esophagitis  EGD (09/04/2018):  No varices, esophagitis LA Class C with ulcerations, hiatal hernia, portal hypertensive gastropathy  *   Mild ascites on ultrasound 09/07/2018.  Not enough fluid for successful paracentesis in early April 2020.  *    Transverse colitis on CT during admission 08/2018 treated with antibiotics.  No colonoscopy. *    Acute anemia.  2 PRBCs transfused at [facility] prior to transfer to [facility].  *    Protein calorie malnutrition, hypoalbuminemia.  Home TPN since 02/2019 prior to hospitalization continues.     PLAN   *   Awaiting transfer to [place] hospital that performs cyst gastrostomy.    *   Allow ice chips.    *   Reimage abdomen for ileus/obstruction, if negative could trial NGT clamp?    Jennye Moccasin   04/13/2019, 8:54 AM Phone [phone]

## 2019-04-14 LAB — GLUCOSE, CAPILLARY
Glucose-Capillary: 112 mg/dL — ABNORMAL HIGH (ref 70–99)
Glucose-Capillary: 116 mg/dL — ABNORMAL HIGH (ref 70–99)
Glucose-Capillary: 117 mg/dL — ABNORMAL HIGH (ref 70–99)
Glucose-Capillary: 130 mg/dL — ABNORMAL HIGH (ref 70–99)
Glucose-Capillary: 146 mg/dL — ABNORMAL HIGH (ref 70–99)
Glucose-Capillary: 153 mg/dL — ABNORMAL HIGH (ref 70–99)

## 2019-04-14 LAB — BASIC METABOLIC PANEL
Anion gap: 8 (ref 5–15)
BUN: <5 mg/dL — ABNORMAL LOW (ref 6–20)
CO2: 23 mmol/L (ref 22–32)
Calcium: 7.7 mg/dL — ABNORMAL LOW (ref 8.9–10.3)
Chloride: 108 mmol/L (ref 98–111)
Creatinine, Ser: 0.75 mg/dL (ref 0.61–1.24)
GFR calc Af Amer: >60 mL/min (ref >60)
GFR calc non Af Amer: >60 mL/min (ref >60)
Glucose, Bld: 571 mg/dL (ref 70–99)
Potassium: 4.9 mmol/L (ref 3.5–5.1)
Sodium: 139 mmol/L (ref 135–145)

## 2019-04-14 LAB — T4, FREE: Free T4: 1.34 ng/dL — ABNORMAL HIGH (ref 0.61–1.12)

## 2019-04-14 LAB — MAGNESIUM: Magnesium: 2.7 mg/dL — ABNORMAL HIGH (ref 1.7–2.4)

## 2019-04-14 LAB — PHOSPHORUS: Phosphorus: 4.5 mg/dL (ref 2.5–4.6)

## 2019-04-14 MED ORDER — BOOST / RESOURCE BREEZE PO LIQD CUSTOM
1.0000 | Freq: Two times a day (BID) | ORAL | Status: DC
  Administered 2019-04-15 – 2019-04-16 (×3): 1 via ORAL

## 2019-04-14 MED ORDER — TRAVASOL 10 % IV SOLN
INTRAVENOUS | Status: AC
  Administered 2019-04-14: 18:00:00 via INTRAVENOUS
  Filled 2019-04-14: qty 1276.8

## 2019-04-14 NOTE — Plan of Care (Signed)
  Problem: Clinical Measurements: Goal: Respiratory complications will improve Outcome: Completed/Met   Problem: Coping: Goal: Level of anxiety will decrease Outcome: Completed/Met   Problem: Elimination: Goal: Will not experience complications related to bowel motility Outcome: Completed/Met Goal: Will not experience complications related to urinary retention Outcome: Completed/Met

## 2019-04-14 NOTE — Progress Notes (Signed)
Nutrition Follow-up  RD working remotely.  DOCUMENTATION CODES:   Not applicable  INTERVENTION:   -TPN management per pharmacy -Boost Breeze po BID, each supplement provides 250 kcal and 9 grams of protein -RD will follow for diet advancement and adjust supplement regimen as appropriate  NUTRITION DIAGNOSIS:   Increased nutrient needs related to acute illness as evidenced by estimated needs.  Ongoing  GOAL:   Patient will meet greater than or equal to 90% of their needs  Progressing   MONITOR:   Labs, Weight trends, I & O's  REASON FOR ASSESSMENT:   Consult New TPN/TNA  ASSESSMENT:   48 yo male admitted from [facility] with sepsis, enlarging pancreatic pseudocysts. PMH includes alcoholic pancreatitis, DM, pancreatic pseudocyst (receiving home TPN since 03/04/2019).  10/31- PICC pulled due to bacteremia 11/2- NGT d/c  11/3- transferred from ICU to PCU 11/4- PICC placed, TPN re-started 11/5- NGT clamped 11/6- NGT removed, advanced to clear liquid diet  Reviewed I/O's: -2.1 L x 24 hours and +2.4 L since admission  UOP: 1.2 L x 24 hours  NGT output: 2 L x 24 hours  Per GI notes, no plan to reinsert NGT at this time.   IR consulted for drainage of pseudocysts, however, not amendable to perc drainage. IR recommends GI consult for possible gastrocystostomy.  PTA, pt was receiving TPN from [facility] in [place]. Home regimen infused 2250 ml x 12 hours and provided 1886 kcals and 100 grams protein, which met 75% of estimated kcal needs and 80% of estimated protein needs.   Pt receiving TPN at 60 ml/hr, which provides 1580 kcals and 81 grams protein, meeting 63% of estimated kcal and 65% of estimated protein needs. Pllan to increase TPN to goal rate of 95 ml/hr at 1800, which provides 2504 kcals and 127 grams protein, meeting 100% of estimated kcal and protein needs.    Plan to continue IV antibiotics. Pt is awaiting transfer back to [facility] in [place].    Labs reviewed: Mg: 2.7 (decreased in TPN), CBGS: 112-146 (inpatient orders for glycemic control are inpatient orders for glycemic control are 0-9 units insulin aspart every 6 hours).   Diet Order:   Diet Order            Diet clear liquid Room service appropriate? Yes; Fluid consistency: Thin  Diet effective now              EDUCATION NEEDS:   Not appropriate for education at this time  Skin:  Skin Assessment: Reviewed RN Assessment  Last BM:  04/14/19  Height:   Ht Readings from Last 1 Encounters:  04/10/19 6' (1.829 m)    Weight:   Wt Readings from Last 1 Encounters:  04/14/19 84.8 kg    Ideal Body Weight:  80.9 kg  BMI:  Body mass index is 25.35 kg/m.  Estimated Nutritional Needs:   Kcal:  2500-2700  Protein:  125-135 gm  Fluid:  >/= 2.4 L    Lariya Kinzie A. Jimmye Norman, RD, LDN, [place] Registered Dietitian II Certified Diabetes Care and Education Specialist Pager: [phone] After hours Pager: [phone]

## 2019-04-14 NOTE — Progress Notes (Signed)
Patient's NG tube came out while nurse tech cleaning patient. Provider notified. Patient currently lying in bed, television on,  call bell within reach.

## 2019-04-14 NOTE — Progress Notes (Signed)
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  Patient admitted here with sepsis due to staph aureus bacteremia likely from infected pancreatic pseudocyst versus infected PICC line.  The PICC line has since been removed.  The patient has been on IV antibiotics and is being followed by ID and GI as we await transfer back to the patient's primary GI doctors at the [facility] in [place].   Assessment & Plan:   Sepsis Resolved. Due to S.aureus bacteremia likely from  infected pancreatic pseudocyst or PICC line (now removed).  PICC line infection would not account for abdominal pain and ileus, suggesting that pancreas is the source.  Transthoracic echocardiogram without evidence of endocarditis.  Repeat blood cultures are negative.  PLAN -  -Continue Vanco, ceftriaxone, metronidazole -Continue to follow cultures -ID following  -GI following -- Awaiting transfer back to [place] to [facility] for further f/u care and procedures   Ileus and gastric outlet obstruction -Likely due to enlarging pseudocyst -NG tube removed today and started patient on clear liquid diet which she is tolerating so far -Limit opiates as possible  Mild hypoglycemia -Continue dextrose and IV fluids (LR) -Continue Accu-Cheks every 4 hours  -Goal BG 140-180 while admitted if requiring insulin  Hx EtOH abuse; last drink 2 months ago - Restarted TPN on 04/12/2019 after placement of new PICC line - IV thiamine - Will likely require TPN until pseudocyst is drained.   Hyponatremia-slowly improving.  Clinically euvolemic. -Continue monitoring daily - gentle IV hydration  -Continue monitor I/O   Hypokalemia; improved -continue to monitor - replete Mg Phos   Non-anion gap metabolic acidosis, possibly due to hyperalimentation -continue  to monitor  Acute on chronic anemia; drop overnight does not appear dilutional -Serial CBCs   Sinus tachycardia due to critical illness-improving -Continue to monitor on telemetry   DVT prophylaxis: Lovenox SQ Code Status:Full code   Consultants:   ID and GI  Procedures:   PICC line placement  Antimicrobials:  IV ceftriaxone, metronidazole and vancomycin   Subjective: NG tube removed today and patient is tolerating p.o. clear liquid diet.  States that his abdominal pain and distention is slightly improved but still endorses the same mild left lower quadrant abdominal pain.  He continues to have loose bowel movements.  Objective: Vitals:   04/14/19 0400 04/14/19 0731 04/14/19 1000 04/14/19 1150  BP: (!) 123/93 (!) 130/98 127/88 (!) 129/95  Pulse: (!) 109 (!) 118 (!) 115 (!) 109  Resp: 18 18    Temp: 98.5 F (36.9 C) 98.7 F (37.1 C)  99.4 F (37.4 C)  TempSrc: Oral Oral  Oral  SpO2: 96% 99% 98% 99%  Weight:      Height:        Intake/Output Summary (Last 24 hours) at 04/14/2019 1604 Last data filed at 04/14/2019 0700 Gross per 24 hour  Intake 1100 ml  Output 1900 ml  Net -800 ml   Filed Weights   04/12/19 0227 04/13/19 0508 04/14/19 0244  Weight: 84.9 kg 84.6 kg 84.8 kg    Examination:  General: pleasant chronically ill appearing male, NAD in bed  HENT: [place]/AT, eyes anicteric neck: no JVD CV: tachycardic, regular rhythm, no murmurs.  Sinus rhythm on telemetry Lungs: resps even non labored on RA  ABD: Protuberant abdomen, mildly distended and tympanic to percussion.    Minimally tender to palpation.  Active bowel sounds.  Copious NGT drainage. EXT: No clubbing, cyanosis, edema Skin: No rashes or wounds Neuro: awake, alert, appropriate  Psych: Cooperative, normal behavior    LOS: 8 days    Time  spent: 25 mins   Charolotte Capuchin, MD [facility] Pager [phone]  If 7PM-7AM, please contact night-coverage [email or web address] Password [email or web address] 04/14/2019, 4:04 PM

## 2019-04-14 NOTE — Progress Notes (Signed)
[place], PA will call MD to clarify reinsertion of NG tube.

## 2019-04-14 NOTE — Plan of Care (Signed)
  Problem: Education: Goal: Knowledge of General Education information will improve Description: Including pain rating scale, medication(s)/side effects and non-pharmacologic comfort measures Outcome: Progressing   Problem: Health Behavior/Discharge Planning: Goal: Ability to manage health-related needs will improve Outcome: Progressing   Problem: Safety: Goal: Ability to remain free from injury will improve Outcome: Progressing   Problem: Nutrition: Goal: Adequate nutrition will be maintained Outcome: Progressing   Problem: Pain Managment: Goal: General experience of comfort will improve Outcome: Progressing

## 2019-04-14 NOTE — Progress Notes (Signed)
   Vital Signs MEWS/VS Documentation      04/14/2019 0400 04/14/2019 0700 04/14/2019 0731 04/14/2019 1000   MEWS Score:  1  2  2  2    MEWS Score Color:  Green  Yellow  Yellow  Yellow   Resp:  18  -  18  -   Pulse:  (!) 109  -  (!) 118  (!) 115   BP:  (!) 123/93  -  (!) 130/98  127/88   Temp:  98.5 F (36.9 C)  -  98.7 F (37.1 C)  -   O2 Device:  Room [employer]  -  Room Air  -       [place].     [place] 04/14/2019,10:32 AM

## 2019-04-14 NOTE — Progress Notes (Signed)
PHARMACY - ADULT TOTAL PARENTERAL NUTRITION CONSULT NOTE  Pharmacy Consult:  TPN Indication:  Pancreatic pseudocyst with GOO  Patient Measurements: Height: 6' (182.9 cm) Weight: 186 lb 14.4 oz (84.8 kg) IBW/kg (Calculated) : 77.6 TPN AdjBW (KG): 85.1 Body mass index is 25.35 kg/m.  Assessment:  36 YOM presented with abdominal pain and vomiting. He has a history of EtOH abuse and cirrhosis.  Found to have pancreatic pseudocyst, decision was made at outside hospital to treat patient conservatively with NPO status and TPN for 6 weeks. TPN was initiated on 03/04/19 with potential end date of 04/15/19. Patient's care has been at [facility] in [place], [place]. Patient was transferred to [facility] on 10/29 after becoming septic and finding that the pseudocysts have become enlarged. TPN held for 1 week due to no central line; patient has been on D5W 50 ml/hr while TPN was held, is at lower risk for refeeding.  GI: prealbumin < 5, NG O/P 2037mL. PPI IV BID; LBM 11/4 Endo: no hx DM - CBGs well controlled except 571 w/ AM labs> RN to recheck Insulin requirements in the past 24 hours: 3 unit SSI Lytes: Mag 2.7 after aggressive repletion x5; K up to 4.9; corrected Ca 9.8 Renal: SCr stable, BUN low- UOP 0.5 ml/kg/hr Pulm: RA Cards: BP controlled, tachy 110-120s on metoprolol Hepatobil: LFTs / tbili WNL, TG 77  Neuro: PRN Dilaudid ID: Vanc/CTX/Flagyl for MRSA bacteremia + IAI. 10/31 BCx neg;  Afebrile, WBC WNL TPN Access: PICC pulled for bacteremia, new PICC 11/4 TPN start date: 11/4  Nutritional Goals (per RD rec on 10/30): 2500-2700 kCal, 125-135gm protein, >2.4L fluid per day  Home TPN formula: [employer]s in [place], 100gm protein (CHO 290g, ILE 50g), 2234mL over 12 hrs, thiamine 100mg   Current Nutrition:  NPO  Plan:  Increase TPN to goal 60ml/hr TPN will provide 127g AA, 365g dextrose, 75g lipids; 2504 kcal; 100% of Kcal goal Electrolytes in TPN decrease Mg, K, phos, Ca; max  acetate   Daily MVI, TE, folate and thiamine in TPN Continue low SSI Q6H; DC of continues with min needs F/U AM labs   Benetta Spar, PharmD, BCPS, [facility] Clinical Pharmacist  Please check AMION for all [place] phone numbers After 10:00 PM, call [place]

## 2019-04-14 NOTE — Progress Notes (Addendum)
RN spoke with [place], PA, no orders to reinsert NG tube at this time. Diet advanced to clear liquids. Patient currently lying in bed with television on, call bell within reach. Patient stated having no symptoms after eating lunch.

## 2019-04-14 NOTE — Progress Notes (Signed)
MD ordered stat CBG before 11:00 AM. CBG taken by nurse tech. CBG is 112.

## 2019-04-15 ENCOUNTER — Other Ambulatory Visit: Payer: Self-pay

## 2019-04-15 LAB — BASIC METABOLIC PANEL
Anion gap: 5 (ref 5–15)
BUN: <5 mg/dL — ABNORMAL LOW (ref 6–20)
CO2: 24 mmol/L (ref 22–32)
Calcium: 7.4 mg/dL — ABNORMAL LOW (ref 8.9–10.3)
Chloride: 110 mmol/L (ref 98–111)
Creatinine, Ser: 0.79 mg/dL (ref 0.61–1.24)
GFR calc Af Amer: >60 mL/min (ref >60)
GFR calc non Af Amer: >60 mL/min (ref >60)
Glucose, Bld: 124 mg/dL — ABNORMAL HIGH (ref 70–99)
Potassium: 2.8 mmol/L — ABNORMAL LOW (ref 3.5–5.1)
Sodium: 139 mmol/L (ref 135–145)

## 2019-04-15 LAB — GLUCOSE, CAPILLARY
Glucose-Capillary: 106 mg/dL — ABNORMAL HIGH (ref 70–99)
Glucose-Capillary: 122 mg/dL — ABNORMAL HIGH (ref 70–99)
Glucose-Capillary: 129 mg/dL — ABNORMAL HIGH (ref 70–99)
Glucose-Capillary: 141 mg/dL — ABNORMAL HIGH (ref 70–99)

## 2019-04-15 LAB — PHOSPHORUS: Phosphorus: 3 mg/dL (ref 2.5–4.6)

## 2019-04-15 LAB — MAGNESIUM: Magnesium: 1.5 mg/dL — ABNORMAL LOW (ref 1.7–2.4)

## 2019-04-15 MED ORDER — TRAVASOL 10 % IV SOLN
INTRAVENOUS | Status: AC
  Administered 2019-04-15: 18:00:00 via INTRAVENOUS
  Filled 2019-04-15: qty 1276.8

## 2019-04-15 MED ORDER — MAGNESIUM SULFATE 2 GM/50ML IV SOLN
2.0000 g | Freq: Once | INTRAVENOUS | Status: AC
  Administered 2019-04-15: 2 g via INTRAVENOUS
  Filled 2019-04-15: qty 50

## 2019-04-15 MED ORDER — POTASSIUM CHLORIDE 10 MEQ/50ML IV SOLN
10.0000 meq | INTRAVENOUS | Status: AC
  Administered 2019-04-15 (×6): 10 meq via INTRAVENOUS
  Filled 2019-04-15 (×7): qty 50

## 2019-04-15 NOTE — Progress Notes (Signed)
PHARMACY - ADULT TOTAL PARENTERAL NUTRITION CONSULT NOTE  Pharmacy Consult:  TPN Indication:  Pancreatic pseudocyst with GOO  Patient Measurements: Height: 6' (182.9 cm) Weight: 192 lb 11.2 oz (87.4 kg) IBW/kg (Calculated) : 77.6 TPN AdjBW (KG): 85.1 Body mass index is 26.13 kg/m.  Assessment:  39 YOM presented with abdominal pain and vomiting. He has a history of EtOH abuse and cirrhosis.  Found to have pancreatic pseudocyst, decision was made at outside hospital to treat patient conservatively with NPO status and TPN for 6 weeks. TPN was initiated on 03/04/19 with potential end date of 04/15/19. Patient's care has been at [facility] in [place], [place]. Patient was transferred to [facility] on 10/29 after becoming septic and finding that the pseudocysts have become enlarged. TPN held for 1 week due to no central line; patient has been on D5W 50 ml/hr while TPN was held, is at lower risk for refeeding.  GI: prealbumin < 5, NGT pulled. PPI IV BID; LBM 11/6 Endo: no hx DM - CBGs well controlled  Insulin requirements in the past 24 hours: 4 units SSI Lytes: Mag 1.5 (will replace); K 2.8 (will replace); Phos 3; corrected Ca 9.8 Renal: SCr stable, BUN low- UOP 0.5 ml/kg/hr Pulm: RA Cards: BP controlled, tachy 110-120s on metoprolol Hepatobil: LFTs / tbili WNL, TG 77  Neuro: PRN Dilaudid ID: Vanc/CTX/Flagyl for MRSA bacteremia + IAI. 10/31>>    ;BCx neg;  Afebrile, WBC WNL  TPN Access: PICC pulled for bacteremia, new PICC 11/4 TPN start date: 11/4  Nutritional Goals (per RD rec on 10/30): 2500-2700 kCal, 125-135gm protein, >2.4L fluid per day  Home TPN formula: [employer]s in [place], 100gm protein (CHO 290g, ILE 50g), 2281mL over 12 hrs, thiamine 100mg   Current Nutrition:  Clear liquid diet  Plan:  Increase TPN to goal 29ml/hr TPN will provide 127g AA, 365g dextrose, 75g lipids; 2504 kcal; 100% of Kcal goal Electrolytes in TPN increase Mg and K, continue phos and Ca; max  acetate   Daily MVI, TE, folate and thiamine in TPN Continue low SSI Q6H; DC of continues with min needs Mag 2 grams IV x 1 KCL 10 meq IV x 6 F/U AM labs   Alanda Slim, PharmD, [facility] Clinical Pharmacist Please see AMION for all Pharmacists' Contact Phone Numbers 04/15/2019, 8:34 AM

## 2019-04-15 NOTE — Plan of Care (Signed)
  Problem: Education: Goal: Knowledge of General Education information will improve Description: Including pain rating scale, medication(s)/side effects and non-pharmacologic comfort measures Outcome: Progressing   Problem: Health Behavior/Discharge Planning: Goal: Ability to manage health-related needs will improve Outcome: Progressing   Problem: Pain Managment: Goal: General experience of comfort will improve Outcome: Progressing   Problem: Safety: Goal: Ability to remain free from injury will improve Outcome: Progressing   Problem: Nutrition: Goal: Adequate nutrition will be maintained Outcome: Progressing

## 2019-04-15 NOTE — Progress Notes (Signed)
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 Severe protein calorie malnutrition, hypoalbuminemia Home TPN since 02/2019  Awaits transfer to his in network hospital in [place] for cyst gastrostomy.  Diet advance to low residue, low fat diet if continues to tolerates clear liquids today.  Avoid opiates as you are able.   When clinically stable could discharge on TPN with outpatient follow-up at [facility] if an inpatient bed for transfer does not become available soon.   GI will move to stand-by. Please call the on-call gastroenterologist with any additional questions or concerns during this hospitalization.   Subjective: Doing well following removal of NG tube. Tolerating clear liquid diet. Would like more solid food to eat. No new complaints today.   Objective:  Vital signs in last 24 hours: Temp:  [98.1 F (36.7 C)-99.4 F (37.4 C)] 98.7 F (37.1 C) (11/07 1057) Pulse Rate:  [105-117] 109 (11/07 1057) Resp:  [17-21] 20 (11/07 1057) BP: (118-140)/(83-97) 130/97 (11/07 1057) SpO2:  [96 %-100 %] 99 % (11/07 1057) Weight:  [87.4 kg] 87.4 kg (11/07 0138) Last BM Date: 04/14/19 General:   Alert, in NAD, chronically ill appearing in NAD.  Sclera anicteric.  Heart:  Regular rate and rhythm; no murmurs Pulm: Clear anteriorly; no wheezing Abdomen:  Protuberent but soft. Improved tympany, although still present.  Nontender.  Normal bowel sounds. No rebound or guarding. LAD: No inguinal or umbilical LAD Extremities:  Without edema. Neurologic:  Alert and  oriented x4;  grossly normal neurologically. Psych:  Alert and cooperative. Normal mood and affect.  Lab Results: No results for input(s): WBC, HGB, HCT, PLT in the last 72 hours. BMET Recent Labs    04/13/19 0608 04/14/19 0801 04/15/19 0413  NA 141 139 139  K 3.3* 4.9 2.8*  CL 112* 108 110  CO2 22 23 24   GLUCOSE 119* 571* 124*  BUN <5* <5* <5*  CREATININE 0.72 0.75 0.79  CALCIUM 7.5* 7.7* 7.4*   LFT Recent Labs    04/13/19 0608  PROT 6.2*  ALBUMIN 1.4*  AST 22  ALT 20  ALKPHOS 78  BILITOT 0.7   Dg Abd 1 View  Result Date: 04/13/2019 CLINICAL DATA:  Abdominal pain, left lower quadrant, history of pancreatitis EXAM: ABDOMEN - 1 VIEW COMPARISON:  03/12/2019 FINDINGS: Limited assessment of the abdomen, excluding a portion of the right flank shows a nasogastric tube in the stomach. Mildly dilated loops of small bowel present in the left lower quadrant with similar appearance to prior exam. Mild-to-moderate colonic distension in the right hemiabdomen, stool and gas in the rectum. No acute bone finding. IMPRESSION: Mildly dilated loops of small and large bowel may reflect global ileus, attention on follow-up. Electronically Signed   By: Zetta Bills  M.D.   On: 04/13/2019 12:11       LOS: 9 days   Tressia Danas  04/15/2019, 11:29 AM

## 2019-04-15 NOTE — Progress Notes (Addendum)
PROGRESS NOTE  Charles Calhoun BJY:[id number] DOB: 25-May-1971 DOA: 04/06/2019 PCP: System, Pcp Not In  HPI/Recap of past 47 hours: 48 year old male with history of alcohol abuse also potential alcoholic pancreatitis who was admitted with possible infected pancreatic pseudocyst is transferred from another facility where he presented with nausea weakness and fever and abdominal pain Subjective: Patient seen and examined at bedside he is doing much better he does still complain of some pain in the left side he is currently on clear liquids started on full liquid is still on TPN.  He is awaiting transfer back to his primary GI doctor at [facility] in [place] his NG tube was removed yesterday.     Assessment/Plan: Principal Problem:   MRSA bacteremia Active Problems:   Sepsis (HCC)   Pancreatitis   Pancreatic pseudocyst   Cirrhosis (HCC)   Diabetes mellitus (HCC)   Alcohol abuse  1.  Sepsis with MRSA bacteremia, resolved, continue IV vancomycin ceftriaxone and Flagyl  2.  Ileus and gastric outlet obstruction likely due to enlarging part of pancreatic pseudocyst, NG tube was removed he is on full liquids now still getting TPN  3.  Electrolyte imbalance slowly improving  4.  History of alcohol abuse last drink was 2 months ago he is on TPN and IV thiamine  5.  Hypertension continue current management  6.  Sinus tachycardia improving is on telemetry monitor  7.  Pancreatic pseudocyst still getting TPN  8 .hypokalemia.  Patient is on TPN potassium content in TPN will be adjusted by pharmacy   Code Status: FULL   Severity of Illness: The appropriate patient status for this patient is INPATIENT. Inpatient status is judged to be reasonable and necessary in order to provide the required intensity of service to ensure the patient's safety. The patient's presenting symptoms, physical exam findings, and initial radiographic and laboratory data in the context of their chronic  comorbidities is felt to place them at high risk for further clinical deterioration. Furthermore, it is not anticipated that the patient will be medically stable for discharge from the hospital within 2 midnights of admission. The following factors support the patient status of inpatient.   " The patient's presenting symptoms include  Abdominal pain with pancreatic pseudocyst " The worrisome physical exam findings include he is still on TPN and awaiting transfer " The initial radiographic and laboratory data are worrisome because pancreatic pseudocyst. " The chronic co-morbidities include alcohol abuse.   * I certify that at the point of admission it is my clinical judgment that the patient will require inpatient hospital care spanning beyond 2 midnights from the point of admission due to high intensity of service, high risk for further deterioration and high frequency of surveillance required.   Barrier to discharge awaiting transfer back to [place]    Family Communication: Patient  Disposition Plan: Home   Consultants:  Infectious disease  GI  Procedures:  PICC line placement and removal  Antimicrobials:  Ceftriaxone metronidazole and vancomycin  DVT prophylaxis: Lovenox   Objective: Vitals:   04/15/19 0138 04/15/19 0141 04/15/19 0358 04/15/19 0832  BP:  (!) 132/97 (!) 140/97 130/88  Pulse:  (!) 105 (!) 110 (!) 113  Resp:  Temp:  98.1 F (36.7 C) 98.6 F (37 C) 98.7 F (37.1 C)  TempSrc:  Oral Oral Oral  SpO2:  98% 99% 100%  Weight: 87.4 kg     Height:        Intake/Output Summary (Last 24  hours) at 04/15/2019 0855 Last data filed at 04/15/2019 0837 [place] per 24 hour  Intake 2220.6 ml  Output 1375 ml  Net 845.6 ml   Filed Weights   04/13/19 0508 04/14/19 0244 04/15/19 0138  Weight: 84.6 kg 84.8 kg 87.4 kg   Body mass index is 26.13 kg/m.  Exam:   General: 48 y.o. year-old male well developed well nourished in no acute distress.  Alert and  oriented x3.  Cardiovascular: Regular rate and rhythm with no rubs or gallops.  No thyromegaly or JVD noted.    Respiratory: Clear to auscultation with no wheezes or rales. Good inspiratory effort.  Abdomen: Soft nontender ,mildly distended with normal bowel sounds x4 quadrants.  Musculoskeletal: No lower extremity edema. 2/4 pulses in all 4 extremities.  Skin: No ulcerative lesions noted or rashes,  Psychiatry: Mood is appropriate for condition and setting    Data Reviewed: CBC: Recent Labs  Lab 04/08/19 1312 04/10/19 0435 04/10/19 1832 04/12/19 0356  WBC 7.4 8.5 10.0 9.1  NEUTROABS  --  6.0  --   --   HGB 7.5* 7.2* 7.3* 7.2*  HCT 24.0* 23.2* 24.1* 24.0*  MCV 76.9* 77.6* 78.8* 79.2*  PLT 150 215 311 475*   Basic Metabolic Panel: Recent Labs  Lab 04/10/19 0435 04/11/19 0823 04/12/19 0356 04/13/19 0608 04/14/19 0801 04/15/19 0413  NA 135 135  --  141 139 139  K 3.2* 3.6  --  3.3* 4.9 2.8*  CL 106 103  --  112* 108 110  CO2 22 19*  --  22 23 24   GLUCOSE 112* 101*  --  119* 571* 124*  BUN <5* <5*  --  <5* <5* <5*  CREATININE 0.61 0.66  --  0.72 0.75 0.79  CALCIUM 7.4* 7.5*  --  7.5* 7.7* 7.4*  MG 1.5* 1.5* 1.5* 1.5* 2.7* 1.5*  PHOS 2.5 2.3* 2.7 3.2 4.5 3.0   GFR: Estimated Creatinine Clearance: 121.3 mL/min (by C-G formula based on SCr of 0.79 mg/dL). Liver Function Tests: Recent Labs  Lab 04/09/19 0607 04/10/19 0435 04/13/19 0608  AST 53* 36 22  ALT 53* 38 20  ALKPHOS 81 81 78  BILITOT 1.1 0.8 0.7  PROT 5.6* 5.7* 6.2*  ALBUMIN 1.3* 1.4* 1.4*   No results for input(s): LIPASE, AMYLASE in the last 168 hours. No results for input(s): AMMONIA in the last 168 hours. Coagulation Profile: No results for input(s): INR, PROTIME in the last 168 hours. Cardiac Enzymes: No results for input(s): CKTOTAL, CKMB, CKMBINDEX, TROPONINI in the last 168 hours. BNP (last 3 results) No results for input(s): PROBNP in the last 8760 hours. HbA1C: No results for  input(s): HGBA1C in the last 72 hours. CBG: Recent Labs  Lab 04/14/19 1012 04/14/19 1104 04/14/19 1619 04/14/19 2222 04/15/19 0511  GLUCAP 112* 116* 117* 153* 129*   Lipid Profile: Recent Labs    04/13/19 0608  TRIG 77   Thyroid Function Tests: Recent Labs    04/14/19 0500  FREET4 1.34*   Anemia Panel: No results for input(s): VITAMINB12, FOLATE, FERRITIN, TIBC, IRON, RETICCTPCT in the last 72 hours. Urine analysis: No results found for: COLORURINE, APPEARANCEUR, LABSPEC, PHURINE, GLUCOSEU, HGBUR, BILIRUBINUR, KETONESUR, PROTEINUR, UROBILINOGEN, NITRITE, LEUKOCYTESUR Sepsis Labs: @LABRCNTIP (procalcitonin:4,lacticidven:4)  ) Recent Results (from the past 240 hour(s))  Culture, blood (routine x 2)     Status: Abnormal   Collection Time: 04/06/19  5:25 PM   Specimen: Site Not Specified; Blood  Result Value Ref Range Status   Specimen  Description SITE NOT SPECIFIED  Final   Special Requests AEROBIC BOTTLE ONLY Blood Culture adequate volume  Final   Culture  Setup Time   Final    GRAM POSITIVE COCCI IN CLUSTERS AEROBIC BOTTLE ONLY CRITICAL VALUE NOTED.  VALUE IS CONSISTENT WITH PREVIOUSLY REPORTED AND CALLED VALUE.    Culture (A)  Final    STAPHYLOCOCCUS AUREUS SUSCEPTIBILITIES PERFORMED ON PREVIOUS CULTURE WITHIN THE LAST 5 DAYS. Performed at [facility], [address] [place], [place] [address]    Report Status 04/09/2019 FINAL  Final  Culture, blood (routine x 2)     Status: Abnormal   Collection Time: 04/06/19  5:25 PM   Specimen: Site Not Specified; Blood  Result Value Ref Range Status   Specimen Description SITE NOT SPECIFIED  Final   Special Requests AEROBIC BOTTLE ONLY Blood Culture adequate volume  Final   Culture  Setup Time   Final    GRAM POSITIVE COCCI IN CLUSTERS AEROBIC BOTTLE ONLY Organism ID to follow CRITICAL RESULT CALLED TO, READ BACK BY AND VERIFIED WITH: Gala Lewandowsky PharmD 9:15 04/07/19 ([email or web address]) Performed at [facility]  Lab, [address], [place], [place] [address]    Culture METHICILLIN RESISTANT STAPHYLOCOCCUS AUREUS (A)  Final   Report Status 04/09/2019 FINAL  Final   Organism ID, Bacteria METHICILLIN RESISTANT STAPHYLOCOCCUS AUREUS  Final      Susceptibility   Methicillin resistant staphylococcus aureus - MIC*    CIPROFLOXACIN >=8 RESISTANT Resistant     ERYTHROMYCIN >=8 RESISTANT Resistant     GENTAMICIN <=0.5 SENSITIVE Sensitive     OXACILLIN >=4 RESISTANT Resistant     TETRACYCLINE >=16 RESISTANT Resistant     VANCOMYCIN <=0.5 SENSITIVE Sensitive     TRIMETH/SULFA <=10 SENSITIVE Sensitive     CLINDAMYCIN >=8 RESISTANT Resistant     RIFAMPIN <=0.5 SENSITIVE Sensitive     Inducible Clindamycin NEGATIVE Sensitive     * METHICILLIN RESISTANT STAPHYLOCOCCUS AUREUS  Blood Culture ID Panel (Reflexed)     Status: Abnormal   Collection Time: 04/06/19  5:25 PM  Result Value Ref Range Status   Enterococcus species NOT DETECTED NOT DETECTED Final   Listeria monocytogenes NOT DETECTED NOT DETECTED Final   Staphylococcus species DETECTED (A) NOT DETECTED Final    Comment: CRITICAL RESULT CALLED TO, READ BACK BY AND VERIFIED WITH: Gala Lewandowsky PharmD 9:15 04/07/19 ([email or web address])    Staphylococcus aureus (BCID) DETECTED (A) NOT DETECTED Final    Comment: Methicillin (oxacillin)-resistant Staphylococcus aureus (MRSA). MRSA is predictably resistant to beta-lactam antibiotics (except ceftaroline). Preferred therapy is vancomycin unless clinically contraindicated. Patient requires contact precautions if  hospitalized. CRITICAL RESULT CALLED TO, READ BACK BY AND VERIFIED WITH: Gala Lewandowsky PharmD 9:15 04/07/19 ([email or web address])    Methicillin resistance DETECTED (A) NOT DETECTED Final    Comment: CRITICAL RESULT CALLED TO, READ BACK BY AND VERIFIED WITH: Gala Lewandowsky PharmD 9:15 04/07/19 ([email or web address])    Streptococcus species NOT DETECTED NOT DETECTED Final   Streptococcus agalactiae NOT DETECTED NOT DETECTED Final    Streptococcus pneumoniae NOT DETECTED NOT DETECTED Final   Streptococcus pyogenes NOT DETECTED NOT DETECTED Final   Acinetobacter baumannii NOT DETECTED NOT DETECTED Final   Enterobacteriaceae species NOT DETECTED NOT DETECTED Final   Enterobacter cloacae complex NOT DETECTED NOT DETECTED Final   Escherichia coli NOT DETECTED NOT DETECTED Final   Klebsiella oxytoca NOT DETECTED NOT DETECTED Final   Klebsiella pneumoniae NOT DETECTED NOT DETECTED Final   Proteus species NOT DETECTED NOT DETECTED  Final   Serratia marcescens NOT DETECTED NOT DETECTED Final   Haemophilus influenzae NOT DETECTED NOT DETECTED Final   Neisseria meningitidis NOT DETECTED NOT DETECTED Final   Pseudomonas aeruginosa NOT DETECTED NOT DETECTED Final   Candida albicans NOT DETECTED NOT DETECTED Final   Candida glabrata NOT DETECTED NOT DETECTED Final   Candida krusei NOT DETECTED NOT DETECTED Final   Candida parapsilosis NOT DETECTED NOT DETECTED Final   Candida tropicalis NOT DETECTED NOT DETECTED Final    Comment: Performed at [facility], [place] [address], [place], [place] [address]  MRSA PCR Screening     Status: Abnormal   Collection Time: 04/06/19  7:05 PM   Specimen: Nasal Mucosa; Nasopharyngeal  Result Value Ref Range Status   MRSA by PCR POSITIVE (A) NEGATIVE Final    Comment:        The GeneXpert MRSA Assay (FDA approved for NASAL specimens only), is one component of a comprehensive MRSA colonization surveillance program. It is not intended to diagnose MRSA infection nor to guide or monitor treatment for MRSA infections. RESULT CALLED TO, READ BACK BY AND VERIFIED WITH: Floy Sabina RN 04/06/19 2102 JDW Performed at [facility], [place] [address], [place], [place] [address]   Urine culture     Status: None   Collection Time: 04/07/19  4:21 AM   Specimen: Urine, Random  Result Value Ref Range Status   Specimen Description URINE, RANDOM  Final   Special Requests NONE  Final   Culture    Final    NO GROWTH Performed at [facility], [place] [address] [place], [place] [address]    Report Status 04/07/2019 FINAL  Final  Culture, blood (routine x 2)     Status: None   Collection Time: 04/08/19  1:19 AM   Specimen: BLOOD  Result Value Ref Range Status   Specimen Description BLOOD RIGHT ANTECUBITAL  Final   Special Requests   Final    BOTTLES DRAWN AEROBIC AND ANAEROBIC Blood Culture results may not be optimal due to an excessive volume of blood received in culture bottles   Culture   Final    NO GROWTH 5 DAYS Performed at [facility], [place] [address], [place],  [address]    Report Status 04/13/2019 FINAL  Final  Culture, blood (routine x 2)     Status: None   Collection Time: 04/08/19  1:19 AM   Specimen: BLOOD  Result Value Ref Range Status   Specimen Description BLOOD RIGHT ANTECUBITAL  Final   Special Requests   Final    BOTTLES DRAWN AEROBIC AND ANAEROBIC Blood Culture adequate volume   Culture   Final    NO GROWTH 5 DAYS Performed at [facility], [place] [address], [place],  [address]    Report Status 04/13/2019 FINAL  Final  SARS CORONAVIRUS 2 (TAT 6-24 HRS) Nasopharyngeal Nasopharyngeal Swab     Status: None   Collection Time: 04/13/19 12:43 PM   Specimen: Nasopharyngeal Swab  Result Value Ref Range Status   SARS Coronavirus 2 NEGATIVE NEGATIVE Final    Comment: (NOTE) SARS-CoV-2 target nucleic acids are NOT DETECTED. The SARS-CoV-2 RNA is generally detectable in upper and lower respiratory specimens during the acute phase of infection. Negative results do not preclude SARS-CoV-2 infection, do not rule out co-infections with other pathogens, and should not be used as the sole basis for treatment or other patient management decisions. Negative results must be combined  with clinical observations, patient history, and epidemiological information. The expected result is Negative. Fact Sheet for  Patients: HairSlick.no Fact Sheet for Healthcare Providers: quierodirigir.com This test is not yet approved or cleared by the [place] FDA and  has been authorized for detection and/or diagnosis of SARS-CoV-2 by FDA under an Emergency Use Authorization (EUA). This EUA will remain  in effect (meaning this test can be used) for the duration of the COVID-19 declaration under Section 56 4(b)(1) of the Act, 21 U.S.C. section 360bbb-3(b)(1), unless the authorization is terminated or revoked sooner. Performed at [facility], [address], [place], [place] [address]       Studies: No results found.  Scheduled Meds:  Chlorhexidine Gluconate Cloth  6 each Topical Daily   Chlorhexidine Gluconate Cloth  6 each Topical Daily   enoxaparin (LOVENOX) injection  40 mg Subcutaneous Q24H   feeding supplement  1 Container Oral BID BM   insulin aspart  0-9 Units Subcutaneous Q6H   metoprolol succinate  25 mg Oral Daily   mupirocin ointment   Nasal BID   pantoprazole (PROTONIX) IV  40 mg Intravenous Q12H   sodium chloride flush  10-40 mL Intracatheter Q12H    Continuous Infusions:  sodium chloride 20 mL/hr at 04/13/19 1127   cefTRIAXone (ROCEPHIN)  IV 2 g (04/14/19 2012)   magnesium sulfate bolus IVPB     metronidazole 500 mg (04/15/19 0411)   potassium chloride     TPN ADULT (ION) 95 mL/hr at 04/15/19 0500   TPN ADULT (ION)     vancomycin 1,500 mg (04/14/19 2147)     LOS: 9 days     Myrtie Neither, MD [facility]  To reach me or the doctor on call, go to: [email or web address] Password [facility]  04/15/2019, 8:55 AM

## 2019-04-16 LAB — BASIC METABOLIC PANEL
Anion gap: 6 (ref 5–15)
BUN: 6 mg/dL (ref 6–20)
CO2: 26 mmol/L (ref 22–32)
Calcium: 7.4 mg/dL — ABNORMAL LOW (ref 8.9–10.3)
Chloride: 107 mmol/L (ref 98–111)
Creatinine, Ser: 0.6 mg/dL — ABNORMAL LOW (ref 0.61–1.24)
GFR calc Af Amer: >60 mL/min (ref >60)
GFR calc non Af Amer: >60 mL/min (ref >60)
Glucose, Bld: 115 mg/dL — ABNORMAL HIGH (ref 70–99)
Potassium: 3.1 mmol/L — ABNORMAL LOW (ref 3.5–5.1)
Sodium: 139 mmol/L (ref 135–145)

## 2019-04-16 LAB — MAGNESIUM: Magnesium: 1.6 mg/dL — ABNORMAL LOW (ref 1.7–2.4)

## 2019-04-16 LAB — GLUCOSE, CAPILLARY
Glucose-Capillary: 119 mg/dL — ABNORMAL HIGH (ref 70–99)
Glucose-Capillary: 129 mg/dL — ABNORMAL HIGH (ref 70–99)
Glucose-Capillary: 130 mg/dL — ABNORMAL HIGH (ref 70–99)
Glucose-Capillary: 148 mg/dL — ABNORMAL HIGH (ref 70–99)

## 2019-04-16 LAB — PHOSPHORUS: Phosphorus: 2.7 mg/dL (ref 2.5–4.6)

## 2019-04-16 MED ORDER — TRAVASOL 10 % IV SOLN
INTRAVENOUS | Status: AC
  Administered 2019-04-16: 19:00:00 via INTRAVENOUS
  Filled 2019-04-16: qty 1276.8

## 2019-04-16 MED ORDER — POTASSIUM CHLORIDE 10 MEQ/50ML IV SOLN
10.0000 meq | INTRAVENOUS | Status: AC
  Administered 2019-04-16 (×5): 10 meq via INTRAVENOUS
  Filled 2019-04-16 (×5): qty 50

## 2019-04-16 MED ORDER — MAGNESIUM SULFATE 4 GM/100ML IV SOLN
4.0000 g | Freq: Once | INTRAVENOUS | Status: AC
  Administered 2019-04-16: 4 g via INTRAVENOUS
  Filled 2019-04-16: qty 100

## 2019-04-16 NOTE — Progress Notes (Signed)
PROGRESS NOTE  Charles Calhoun DEY:[id number] DOB: 1970-10-21 DOA: 04/06/2019 PCP: System, Pcp Not In  HPI/Recap of past 41 hours: 48 year old male with history of alcohol abuse also potential alcoholic pancreatitis who was admitted with possible infected pancreatic pseudocyst is transferred from another facility where he presented with nausea weakness and fever and abdominal pain Subjective : Patient seen and examined at bedside he is doing much better he does still complain of some pain in the left side he is currently on clear liquids started on full liquid is still on TPN.  He is awaiting transfer back to his primary GI doctor at [facility] clinic in [place] his NG tube was removed yesterday. Subjective April 16, 2019: Patient seen and examined at bedside he is doing very well he ate his full liquid diet and tolerated it well denies abdominal pain    Assessment/Plan: Principal Problem:   MRSA bacteremia Active Problems:   Sepsis (HCC)   Pancreatitis   Pancreatic pseudocyst   Cirrhosis (HCC)   Diabetes mellitus (HCC)   Alcohol abuse  1.  Sepsis with MRSA bacteremia, resolved, is on IV vancomycin ceftriaxone and Flagyl.  Cultures are negative.  He has been afebrile overall begin to de-escalate the antibiotics per pharmacy  2.  Ileus and gastric outlet obstruction likely due to enlarging part of pancreatic pseudocyst, NG tube was removed he is on full liquids now still getting TPN  3.  Electrolyte imbalance slowly improving  4.  History of alcohol abuse last drink was 2 months ago he is on TPN and IV thiamine  5.  Hypertension continue current management  6.  Sinus tachycardia improving is on telemetry monitor  7.  Pancreatic pseudocyst still getting TPN.  GI recommended to advance diet to low residue low-fat diet if he continues to tolerate liquids and also to avoid opiates.  We will advance his diet today  8 .hypokalemia.  Patient is on TPN potassium content in TPN will be  adjusted by pharmacy  9.  Severe protein calorie malnutrition with hypoalbuminemia.  Patient will continue TPN  Code Status: FULL   Severity of Illness: The appropriate patient status for this patient is INPATIENT. Inpatient status is judged to be reasonable and necessary in order to provide the required intensity of service to ensure the patient's safety. The patient's presenting symptoms, physical exam findings, and initial radiographic and laboratory data in the context of their chronic comorbidities is felt to place them at high risk for further clinical deterioration. Furthermore, it is not anticipated that the patient will be medically stable for discharge from the hospital within 2 midnights of admission. The following factors support the patient status of inpatient.   " The patient's presenting symptoms include  Abdominal pain with pancreatic pseudocyst " The worrisome physical exam findings include he is still on TPN and awaiting transfer " The initial radiographic and laboratory data are worrisome because pancreatic pseudocyst. " The chronic co-morbidities include alcohol abuse.   * I certify that at the point of admission it is my clinical judgment that the patient will require inpatient hospital care spanning beyond 2 midnights from the point of admission due to high intensity of service, high risk for further deterioration and high frequency of surveillance required.   Barrier to discharge awaiting transfer back to [place]    Family Communication: Patient  Disposition Plan: Home   Consultants:  Infectious disease  GI  Procedures:  PICC line placement and removal  Antimicrobials:  Ceftriaxone metronidazole and vancomycin  DVT prophylaxis: Lovenox   Objective: Vitals:   04/15/19 2007 04/15/19 2352 04/16/19 0602 04/16/19 0833  BP: (!) 142/99 (!) 131/99 (!) 136/99 (!) 131/98  Pulse: (!) 112 (!) 118 (!) 105 (!) 105  Resp: 20 18 18 20   Temp: 98.8 F (37.1 C)  98.3 F (36.8 C) 98.4 F (36.9 C) 98.3 F (36.8 C)  TempSrc: Oral Oral Oral Oral  SpO2: 99% 100% 100% 100%  Weight:   91.6 kg   Height:        Intake/Output Summary (Last 24 hours) at 04/16/2019 0835 Last data filed at 04/16/2019 0804 Gross per 24 hour  Intake 4591.71 ml  Output 2176 ml  Net 2415.71 ml   Filed Weights   04/14/19 0244 04/15/19 0138 04/16/19 0602  Weight: 84.8 kg 87.4 kg 91.6 kg   Body mass index is 27.38 kg/m.  Exam:  . General: 48 y.o. year-old male well developed well nourished in no acute distress.  Alert and oriented x3. . Cardiovascular: Regular rate and rhythm with no rubs or gallops.  No thyromegaly or JVD noted.   44 Respiratory: Clear to auscultation with no wheezes or rales. Good inspiratory effort. . Abdomen: Soft nontender ,mildly distended with normal bowel sounds x4 quadrants. . Musculoskeletal: No lower extremity edema. 2/4 pulses in all 4 extremities. . Skin: No ulcerative lesions noted or rashes, . Psychiatry: Mood is appropriate for condition and setting    Data Reviewed: CBC: Recent Labs  Lab 04/10/19 0435 04/10/19 1832 04/12/19 0356  WBC 8.5 10.0 9.1  NEUTROABS 6.0  --   --   HGB 7.2* 7.3* 7.2*  HCT 23.2* 24.1* 24.0*  MCV 77.6* 78.8* 79.2*  PLT 215 311 475*   Basic Metabolic Panel: Recent Labs  Lab 04/11/19 0823 04/12/19 0356 04/13/19 0608 04/14/19 0801 04/15/19 0413 04/16/19 0607  NA 135  --  141 139 139 139  K 3.6  --  3.3* 4.9 2.8* 3.1*  CL 103  --  112* 108 110 107  CO2 19*  --  22 23 24 26   GLUCOSE 101*  --  119* 571* 124* 115*  BUN <5*  --  <5* <5* <5* 6  CREATININE 0.66  --  0.72 0.75 0.79 0.60*  CALCIUM 7.5*  --  7.5* 7.7* 7.4* 7.4*  MG 1.5* 1.5* 1.5* 2.7* 1.5* 1.6*  PHOS 2.3* 2.7 3.2 4.5 3.0 2.7   GFR: Estimated Creatinine Clearance: 121.3 mL/min (A) (by C-G formula based on SCr of 0.6 mg/dL (L)). Liver Function Tests: Recent Labs  Lab 04/10/19 0435 04/13/19 0608  AST 36 22  ALT 38 20  ALKPHOS 81  78  BILITOT 0.8 0.7  PROT 5.7* 6.2*  ALBUMIN 1.4* 1.4*   No results for input(s): LIPASE, AMYLASE in the last 168 hours. No results for input(s): AMMONIA in the last 168 hours. Coagulation Profile: No results for input(s): INR, PROTIME in the last 168 hours. Cardiac Enzymes: No results for input(s): CKTOTAL, CKMB, CKMBINDEX, TROPONINI in the last 168 hours. BNP (last 3 results) No results for input(s): PROBNP in the last 8760 hours. HbA1C: No results for input(s): HGBA1C in the last 72 hours. CBG: Recent Labs  Lab 04/15/19 0511 04/15/19 1055 04/15/19 1553 04/15/19 2339 04/16/19 0513  GLUCAP 129* 122* 106* 141* 119*   Lipid Profile: No results for input(s): CHOL, HDL, LDLCALC, TRIG, CHOLHDL, LDLDIRECT in the last 72 hours. Thyroid Function Tests: Recent Labs    04/14/19 0500  FREET4 1.34*   Anemia Panel: No  results for input(s): VITAMINB12, FOLATE, FERRITIN, TIBC, IRON, RETICCTPCT in the last 72 hours. Urine analysis: No results found for: COLORURINE, APPEARANCEUR, LABSPEC, PHURINE, GLUCOSEU, HGBUR, BILIRUBINUR, KETONESUR, PROTEINUR, UROBILINOGEN, NITRITE, LEUKOCYTESUR Sepsis Labs: @LABRCNTIP (procalcitonin:4,lacticidven:4)  ) Recent Results (from the past 240 hour(s))  Culture, blood (routine x 2)     Status: Abnormal   Collection Time: 04/06/19  5:25 PM   Specimen: Site Not Specified; Blood  Result Value Ref Range Status   Specimen Description SITE NOT SPECIFIED  Final   Special Requests AEROBIC BOTTLE ONLY Blood Culture adequate volume  Final   Culture  Setup Time   Final    GRAM POSITIVE COCCI IN CLUSTERS AEROBIC BOTTLE ONLY CRITICAL VALUE NOTED.  VALUE IS CONSISTENT WITH PREVIOUSLY REPORTED AND CALLED VALUE.    Culture (A)  Final    STAPHYLOCOCCUS AUREUS SUSCEPTIBILITIES PERFORMED ON PREVIOUS CULTURE WITHIN THE LAST 5 DAYS. Performed at [facility], [address] [place], [place] [address]    Report Status 04/09/2019 FINAL  Final  Culture, blood  (routine x 2)     Status: Abnormal   Collection Time: 04/06/19  5:25 PM   Specimen: Site Not Specified; Blood  Result Value Ref Range Status   Specimen Description SITE NOT SPECIFIED  Final   Special Requests AEROBIC BOTTLE ONLY Blood Culture adequate volume  Final   Culture  Setup Time   Final    GRAM POSITIVE COCCI IN CLUSTERS AEROBIC BOTTLE ONLY Organism ID to follow CRITICAL RESULT CALLED TO, READ BACK BY AND VERIFIED WITH: Gala Lewandowsky. Baumeister PharmD 9:15 04/07/19 ([email or web address]) Performed at [facility], [address] [place], [place] [address]    Culture METHICILLIN RESISTANT STAPHYLOCOCCUS AUREUS (A)  Final   Report Status 04/09/2019 FINAL  Final   Organism ID, Bacteria METHICILLIN RESISTANT STAPHYLOCOCCUS AUREUS  Final      Susceptibility   Methicillin resistant staphylococcus aureus - MIC*    CIPROFLOXACIN >=8 RESISTANT Resistant     ERYTHROMYCIN >=8 RESISTANT Resistant     GENTAMICIN <=0.5 SENSITIVE Sensitive     OXACILLIN >=4 RESISTANT Resistant     TETRACYCLINE >=16 RESISTANT Resistant     VANCOMYCIN <=0.5 SENSITIVE Sensitive     TRIMETH/SULFA <=10 SENSITIVE Sensitive     CLINDAMYCIN >=8 RESISTANT Resistant     RIFAMPIN <=0.5 SENSITIVE Sensitive     Inducible Clindamycin NEGATIVE Sensitive     * METHICILLIN RESISTANT STAPHYLOCOCCUS AUREUS  Blood Culture ID Panel (Reflexed)     Status: Abnormal   Collection Time: 04/06/19  5:25 PM  Result Value Ref Range Status   Enterococcus species NOT DETECTED NOT DETECTED Final   Listeria monocytogenes NOT DETECTED NOT DETECTED Final   Staphylococcus species DETECTED (A) NOT DETECTED Final    Comment: CRITICAL RESULT CALLED TO, READ BACK BY AND VERIFIED WITH: Gala Lewandowsky. Baumeister PharmD 9:15 04/07/19 ([email or web address])    Staphylococcus aureus (BCID) DETECTED (A) NOT DETECTED Final    Comment: Methicillin (oxacillin)-resistant Staphylococcus aureus (MRSA). MRSA is predictably resistant to beta-lactam antibiotics (except ceftaroline). Preferred  therapy is vancomycin unless clinically contraindicated. Patient requires contact precautions if  hospitalized. CRITICAL RESULT CALLED TO, READ BACK BY AND VERIFIED WITH: Gala Lewandowsky. Baumeister PharmD 9:15 04/07/19 ([email or web address])    Methicillin resistance DETECTED (A) NOT DETECTED Final    Comment: CRITICAL RESULT CALLED TO, READ BACK BY AND VERIFIED WITH: Gala Lewandowsky. Baumeister PharmD 9:15 04/07/19 ([email or web address])    Streptococcus species NOT DETECTED NOT DETECTED Final   Streptococcus agalactiae NOT DETECTED NOT DETECTED Final  Streptococcus pneumoniae NOT DETECTED NOT DETECTED Final   Streptococcus pyogenes NOT DETECTED NOT DETECTED Final   Acinetobacter baumannii NOT DETECTED NOT DETECTED Final   Enterobacteriaceae species NOT DETECTED NOT DETECTED Final   Enterobacter cloacae complex NOT DETECTED NOT DETECTED Final   Escherichia coli NOT DETECTED NOT DETECTED Final   Klebsiella oxytoca NOT DETECTED NOT DETECTED Final   Klebsiella pneumoniae NOT DETECTED NOT DETECTED Final   Proteus species NOT DETECTED NOT DETECTED Final   Serratia marcescens NOT DETECTED NOT DETECTED Final   Haemophilus influenzae NOT DETECTED NOT DETECTED Final   Neisseria meningitidis NOT DETECTED NOT DETECTED Final   Pseudomonas aeruginosa NOT DETECTED NOT DETECTED Final   Candida albicans NOT DETECTED NOT DETECTED Final   Candida glabrata NOT DETECTED NOT DETECTED Final   Candida krusei NOT DETECTED NOT DETECTED Final   Candida parapsilosis NOT DETECTED NOT DETECTED Final   Candida tropicalis NOT DETECTED NOT DETECTED Final    Comment: Performed at [facility], [place] [address], [place], [place] [address]  MRSA PCR Screening     Status: Abnormal   Collection Time: 04/06/19  7:05 PM   Specimen: Nasal Mucosa; Nasopharyngeal  Result Value Ref Range Status   MRSA by PCR POSITIVE (A) NEGATIVE Final    Comment:        The GeneXpert MRSA Assay (FDA approved for NASAL specimens only), is one component of a comprehensive MRSA  colonization surveillance program. It is not intended to diagnose MRSA infection nor to guide or monitor treatment for MRSA infections. RESULT CALLED TO, READ BACK BY AND VERIFIED WITH: Floy Sabina RN 04/06/19 2102 JDW Performed at [facility], [place] [address], [place], [place] [address]   Urine culture     Status: None   Collection Time: 04/07/19  4:21 AM   Specimen: Urine, Random  Result Value Ref Range Status   Specimen Description URINE, RANDOM  Final   Special Requests NONE  Final   Culture   Final    NO GROWTH Performed at [facility], [place] [address] [place], [place] [address]    Report Status 04/07/2019 FINAL  Final  Culture, blood (routine x 2)     Status: None   Collection Time: 04/08/19  1:19 AM   Specimen: BLOOD  Result Value Ref Range Status   Specimen Description BLOOD RIGHT ANTECUBITAL  Final   Special Requests   Final    BOTTLES DRAWN AEROBIC AND ANAEROBIC Blood Culture results may not be optimal due to an excessive volume of blood received in culture bottles   Culture   Final    NO GROWTH 5 DAYS Performed at [facility], [place] [address], [place], [place] [address]    Report Status 04/13/2019 FINAL  Final  Culture, blood (routine x 2)     Status: None   Collection Time: 04/08/19  1:19 AM   Specimen: BLOOD  Result Value Ref Range Status   Specimen Description BLOOD RIGHT ANTECUBITAL  Final   Special Requests   Final    BOTTLES DRAWN AEROBIC AND ANAEROBIC Blood Culture adequate volume   Culture   Final    NO GROWTH 5 DAYS Performed at [facility],  [address], [place],  [address]    Report Status 04/13/2019 FINAL  Final  SARS CORONAVIRUS 2 (TAT 6-24 HRS) Nasopharyngeal Nasopharyngeal Swab     Status: None   Collection Time: 04/13/19 12:43 PM   Specimen: Nasopharyngeal Swab  Result Value Ref Range Status   SARS Coronavirus 2 NEGATIVE NEGATIVE Final    Comment: (NOTE) SARS-CoV-2 target nucleic acids are NOT DETECTED. The  SARS-CoV-2 RNA is generally detectable in upper and lower respiratory specimens during the acute phase of infection. Negative results do not preclude SARS-CoV-2 infection, do not rule out co-infections with other pathogens, and should not be used as the sole basis for treatment or other patient management decisions. Negative results must be combined with clinical observations, patient history, and epidemiological information. The expected result is Negative. Fact Sheet for Patients: HairSlick.no Fact Sheet for Healthcare Providers: quierodirigir.com This test is not yet approved or cleared by the [place] FDA and  has been authorized for detection and/or diagnosis of SARS-CoV-2 by FDA under an Emergency Use Authorization (EUA). This EUA will remain  in effect (meaning this test can be used) for the duration of the COVID-19 declaration under Section 56 4(b)(1) of the Act, 21 U.S.C. section 360bbb-3(b)(1), unless the authorization is terminated or revoked sooner. Performed at [facility], [address], [place], [place] [address]       Studies: No results found.  Scheduled Meds: . Chlorhexidine Gluconate Cloth  6 each Topical Daily  . Chlorhexidine Gluconate Cloth  6 each Topical Daily  . enoxaparin (LOVENOX) injection  40 mg Subcutaneous Q24H  . feeding supplement  1 Container Oral BID BM  . insulin aspart  0-9 Units Subcutaneous Q6H  . metoprolol succinate  25 mg Oral Daily  . mupirocin ointment   Nasal BID  . pantoprazole (PROTONIX) IV  40 mg Intravenous Q12H  . sodium chloride flush  10-40 mL Intracatheter Q12H    Continuous Infusions: . sodium chloride Stopped (04/13/19 1129)  . cefTRIAXone (ROCEPHIN)  IV 2 g (04/15/19 2010)  . magnesium sulfate bolus IVPB    . metronidazole 500 mg (04/16/19 0447)  . potassium chloride    . TPN ADULT (ION) 95 mL/hr at 04/15/19 1747  . TPN ADULT (ION)    . vancomycin  1,500 mg (04/15/19 2346)     LOS: 10 days     Myrtie Neither, MD [facility]  To reach me or the doctor on call, go to: [email or web address] Password [facility]  04/16/2019, 8:35 AM

## 2019-04-16 NOTE — Progress Notes (Signed)
PHARMACY - ADULT TOTAL PARENTERAL NUTRITION CONSULT NOTE  Pharmacy Consult:  TPN Indication:  Pancreatic pseudocyst with GOO  Patient Measurements: Height: 6' (182.9 cm) Weight: 201 lb 14.4 oz (91.6 kg) IBW/kg (Calculated) : 77.6 TPN AdjBW (KG): 85.1 Body mass index is 27.38 kg/m.  Assessment:  63 YOM presented with abdominal pain and vomiting. He has a history of EtOH abuse and cirrhosis.  Found to have pancreatic pseudocyst, decision was made at outside hospital to treat patient conservatively with NPO status and TPN for 6 weeks. TPN was initiated on 03/04/19 with potential end date of 04/15/19. Patient's care has been at [facility] in [place], [place]. Patient was transferred to [facility] on 10/29 after becoming septic and finding that the pseudocysts have become enlarged. TPN held for 1 week due to no central line; patient has been on D5W 50 ml/hr while TPN was held, is at lower risk for refeeding.  GI: prealbumin < 5, NGT pulled. PPI IV BID; LBM 11/7 Endo: no hx DM - CBGs well controlled  Insulin requirements in the past 24 hours: 3 units SSI Lytes: Mag 1.6 (will replace); K 3.1 (will replace); Phos2.7; corrected Ca 9.8 Renal: SCr stable, BUN low- UOP 0.9 ml/kg/hr Pulm: RA Cards: BP controlled, tachy 105-118 on metoprolol Hepatobil: LFTs / tbili WNL, TG 77  Neuro: PRN Dilaudid ID: Vanc/CTX/Flagyl for MRSA bacteremia + IAI. 10/31>>    ;BCx neg;  Afebrile, WBC WNL  TPN Access: PICC pulled for bacteremia, new PICC 11/4 TPN start date: 11/4  Nutritional Goals (per RD rec on 10/30): 2500-2700 kCal, 125-135gm protein, >2.4L fluid per day  Home TPN formula: Bioscripts in [place], 100gm protein (CHO 290g, ILE 50g), 2273mL over 12 hrs, thiamine 100mg   Current Nutrition:  Advanced to full liquid diet on 11/7  Plan:  Increase TPN to goal 7ml/hr TPN will provide 127g AA, 365g dextrose, 75g lipids; 2504 kcal; 100% of Kcal goal Electrolytes in TPN increase Mg and K, continue  phos and Ca; max acetate   Daily MVI, TE, folate and thiamine in TPN Continue low SSI Q6H; DC of continues with min needs Mag 4 grams IV x 1 KCL 10 meq IV x 5 F/U TPN labs   Alanda Slim, PharmD, [facility] Clinical Pharmacist Please see AMION for all Pharmacists' Contact Phone Numbers 04/16/2019, 8:06 AM

## 2019-04-16 NOTE — Progress Notes (Signed)
Pharmacy Antibiotic Note  Charles Calhoun is a 48 y.o. male admitted on 04/06/2019 as a transfer from [facility] for higher level of care in setting of MRSA bacteremia from PICC line vs. pancreatitis/pancreatic pseudocyst. Pharmacy is consulted for vancomycin. Also continuing on ceftriaxone and Flagyl per ID. S/p central line holiday and repeat BCx remain negative. PICC replaced for TPN access 11/4. NG tube removed 11/6 and advancing diets to clear liquids. Appropriate labs including Vancomycin levels will be ordered 11/9.  Calculated AUC was within goal on 11/2 for current vancomycin dose. Scr stable, WBC wnl, afebrile.  Plan: Continue vancomycin 1500mg  IV q12h Ceftriaxone 2g IV q24h + Flagyl 500mg  IV q8h per MD Monitor clinical progress, c/s, renal function F/u LOT, vancomycin levels weekly   Height: 6' (182.9 cm) Weight: 201 lb 14.4 oz (91.6 kg) IBW/kg (Calculated) : 77.6  Temp (24hrs), Avg:98.6 F (37 C), Min:98.3 F (36.8 C), Max:98.8 F (37.1 C)  Recent Labs  Lab 04/10/19 0435 04/10/19 0814 04/10/19 1449 04/10/19 1832 04/11/19 0823 04/12/19 0356 04/13/19 0608 04/14/19 0801 04/15/19 0413 04/16/19 0607  WBC 8.5  --   --  10.0  --  9.1  --   --   --   --   CREATININE 0.61  --   --   --  0.66  --  0.72 0.75 0.79 0.60*  VANCOTROUGH  --   --  12*  --   --   --   --   --   --   --   VANCOPEAK 11*  --   --   --   --   --   --   --   --   --   VANCORANDOM  --  29  --   --   --   --   --   --   --   --     Estimated Creatinine Clearance: 121.3 mL/min (A) (by C-G formula based on SCr of 0.6 mg/dL (L)).    No Known Allergies  Antimicrobials this admission: Meropenem 10/29 >> 10/30 Zosyn 10/29 at OSH  Vanc 10/29 >>  CTX 10/30>> Flagyl 10/30>>  11/2:  VT pk 29, VT 12, calc AUC 524 - continue current dose  Microbiology results: 10/31: BC - ngtd OSH: 3/4 BC positive for GPC  10/30 Ucx: neg 10/30 Bcx: MRSA 10/29 MRSA PCR positive   Georga Bora,  PharmD Clinical Pharmacist 04/16/2019 8:56 AM Please check AMION for all [place] numbers

## 2019-04-17 LAB — DIFFERENTIAL
Abs Immature Granulocytes: 0.04 10*3/uL (ref 0.00–0.07)
Basophils Absolute: 0 10*3/uL (ref 0.0–0.1)
Basophils Relative: 1 %
Eosinophils Absolute: 0.2 10*3/uL (ref 0.0–0.5)
Eosinophils Relative: 2 %
Immature Granulocytes: 1 %
Lymphocytes Relative: 16 %
Lymphs Abs: 1.2 10*3/uL (ref 0.7–4.0)
Monocytes Absolute: 0.7 10*3/uL (ref 0.1–1.0)
Monocytes Relative: 10 %
Neutro Abs: 5.6 10*3/uL (ref 1.7–7.7)
Neutrophils Relative %: 70 %

## 2019-04-17 LAB — TRIGLYCERIDES: Triglycerides: 55 mg/dL (ref <150)

## 2019-04-17 LAB — CBC WITH DIFFERENTIAL/PLATELET
Abs Immature Granulocytes: 0.04 10*3/uL (ref 0.00–0.07)
Basophils Absolute: 0 10*3/uL (ref 0.0–0.1)
Basophils Relative: 0 %
Eosinophils Absolute: 0.2 10*3/uL (ref 0.0–0.5)
Eosinophils Relative: 2 %
HCT: 24.1 % — ABNORMAL LOW (ref 39.0–52.0)
Hemoglobin: 7 g/dL — ABNORMAL LOW (ref 13.0–17.0)
Immature Granulocytes: 1 %
Lymphocytes Relative: 15 %
Lymphs Abs: 1.2 10*3/uL (ref 0.7–4.0)
MCH: 24.1 pg — ABNORMAL LOW (ref 26.0–34.0)
MCHC: 29 g/dL — ABNORMAL LOW (ref 30.0–36.0)
MCV: 82.8 fL (ref 80.0–100.0)
Monocytes Absolute: 0.7 10*3/uL (ref 0.1–1.0)
Monocytes Relative: 9 %
Neutro Abs: 5.8 10*3/uL (ref 1.7–7.7)
Neutrophils Relative %: 73 %
Platelets: 426 10*3/uL — ABNORMAL HIGH (ref 150–400)
RBC: 2.91 MIL/uL — ABNORMAL LOW (ref 4.22–5.81)
RDW: 20.3 % — ABNORMAL HIGH (ref 11.5–15.5)
WBC: 7.9 10*3/uL (ref 4.0–10.5)
nRBC: 0 % (ref 0.0–0.2)

## 2019-04-17 LAB — COMPREHENSIVE METABOLIC PANEL
ALT: 11 U/L (ref 0–44)
AST: 30 U/L (ref 15–41)
Albumin: 1.3 g/dL — ABNORMAL LOW (ref 3.5–5.0)
Alkaline Phosphatase: 70 U/L (ref 38–126)
Anion gap: 4 — ABNORMAL LOW (ref 5–15)
BUN: 7 mg/dL (ref 6–20)
CO2: 27 mmol/L (ref 22–32)
Calcium: 7.6 mg/dL — ABNORMAL LOW (ref 8.9–10.3)
Chloride: 108 mmol/L (ref 98–111)
Creatinine, Ser: 0.7 mg/dL (ref 0.61–1.24)
GFR calc Af Amer: >60 mL/min (ref >60)
GFR calc non Af Amer: >60 mL/min (ref >60)
Glucose, Bld: 128 mg/dL — ABNORMAL HIGH (ref 70–99)
Potassium: 3.4 mmol/L — ABNORMAL LOW (ref 3.5–5.1)
Sodium: 139 mmol/L (ref 135–145)
Total Bilirubin: 0.5 mg/dL (ref 0.3–1.2)
Total Protein: 6.4 g/dL — ABNORMAL LOW (ref 6.5–8.1)

## 2019-04-17 LAB — CBC
HCT: 24.1 % — ABNORMAL LOW (ref 39.0–52.0)
Hemoglobin: 7 g/dL — ABNORMAL LOW (ref 13.0–17.0)
MCH: 23.8 pg — ABNORMAL LOW (ref 26.0–34.0)
MCHC: 29 g/dL — ABNORMAL LOW (ref 30.0–36.0)
MCV: 82 fL (ref 80.0–100.0)
Platelets: 457 10*3/uL — ABNORMAL HIGH (ref 150–400)
RBC: 2.94 MIL/uL — ABNORMAL LOW (ref 4.22–5.81)
RDW: 20.2 % — ABNORMAL HIGH (ref 11.5–15.5)
WBC: 7.8 10*3/uL (ref 4.0–10.5)
nRBC: 0 % (ref 0.0–0.2)

## 2019-04-17 LAB — GLUCOSE, CAPILLARY
Glucose-Capillary: 130 mg/dL — ABNORMAL HIGH (ref 70–99)
Glucose-Capillary: 133 mg/dL — ABNORMAL HIGH (ref 70–99)
Glucose-Capillary: 114 mg/dL — ABNORMAL HIGH (ref 70–99)

## 2019-04-17 LAB — VANCOMYCIN, PEAK: Vancomycin Pk: 34 ug/mL (ref 30–40)

## 2019-04-17 LAB — MAGNESIUM: Magnesium: 1.7 mg/dL (ref 1.7–2.4)

## 2019-04-17 LAB — PREALBUMIN: Prealbumin: <5 mg/dL — ABNORMAL LOW (ref 18–38)

## 2019-04-17 LAB — PHOSPHORUS: Phosphorus: 2.5 mg/dL (ref 2.5–4.6)

## 2019-04-17 MED ORDER — AMOXICILLIN-POT CLAVULANATE 875-125 MG PO TABS
1.0000 | ORAL_TABLET | Freq: Two times a day (BID) | ORAL | Status: DC
  Administered 2019-04-17 – 2019-04-24 (×15): 1 via ORAL
  Filled 2019-04-17 (×15): qty 1

## 2019-04-17 MED ORDER — ADULT MULTIVITAMIN W/MINERALS CH
1.0000 | ORAL_TABLET | Freq: Every day | ORAL | Status: DC
  Administered 2019-04-19 – 2019-04-24 (×6): 1 via ORAL
  Filled 2019-04-17 (×6): qty 1

## 2019-04-17 MED ORDER — ENSURE ENLIVE PO LIQD
237.0000 mL | Freq: Two times a day (BID) | ORAL | Status: DC
  Administered 2019-04-19 – 2019-04-24 (×7): 237 mL via ORAL

## 2019-04-17 MED ORDER — MAGNESIUM SULFATE 2 GM/50ML IV SOLN
2.0000 g | Freq: Once | INTRAVENOUS | Status: AC
  Administered 2019-04-17: 2 g via INTRAVENOUS
  Filled 2019-04-17: qty 50

## 2019-04-17 MED ORDER — POTASSIUM CHLORIDE 10 MEQ/50ML IV SOLN
10.0000 meq | INTRAVENOUS | Status: AC
  Administered 2019-04-17: 10 meq via INTRAVENOUS
  Filled 2019-04-17 (×4): qty 50

## 2019-04-17 MED ORDER — VITAMIN B-1 100 MG PO TABS
100.0000 mg | ORAL_TABLET | Freq: Every day | ORAL | Status: DC
  Administered 2019-04-19 – 2019-04-24 (×6): 100 mg via ORAL
  Filled 2019-04-17 (×6): qty 1

## 2019-04-17 MED ORDER — TRAVASOL 10 % IV SOLN
INTRAVENOUS | Status: AC
  Administered 2019-04-17: 19:00:00 via INTRAVENOUS
  Filled 2019-04-17: qty 537.6

## 2019-04-17 MED ORDER — POTASSIUM CHLORIDE 10 MEQ/50ML IV SOLN
10.0000 meq | Freq: Once | INTRAVENOUS | Status: AC
  Administered 2019-04-17: 10 meq via INTRAVENOUS
  Filled 2019-04-17: qty 50

## 2019-04-17 MED ORDER — FOLIC ACID 1 MG PO TABS
1.0000 mg | ORAL_TABLET | Freq: Every day | ORAL | Status: DC
  Administered 2019-04-19 – 2019-04-24 (×6): 1 mg via ORAL
  Filled 2019-04-17 (×6): qty 1

## 2019-04-17 NOTE — Progress Notes (Signed)
[facility]   Reason for visit: Follow up on bacteremia  Interval History: some pain in left quadrant of abdomen, eating better.  No fever and WBC wnl.  No new complaints otherwise.  No associated rash or diarrhea. Repeat blood cultures have remained negative.  Day 12 total antibiotics Day 11 vancomycin, ceftriaxone, metronidazole   Physical Exam: Constitutional:  Vitals:   04/17/19 0541 04/17/19 0914  BP: (!) 130/91 (!) 134/96  Pulse: (!) 105 (!) 103  Resp: 20   Temp: 98.4 F (36.9 C) 98.5 F (36.9 C)  SpO2: 100% 99%   patient appears in NAD Eyes: anicteric Respiratory: Normal respiratory effort; CTA B Cardiovascular: RRR GI: soft, some mild tenderness in left quadrant, soft  Review of Systems: Constitutional: negative for fevers and chills Gastrointestinal: negative for nausea and diarrhea Integument/breast: negative for rash Musculoskeletal: negative for myalgias and arthralgias  Lab Results  Component Value Date   WBC 7.9 04/17/2019   HGB 7.0 (L) 04/17/2019   HCT 24.1 (L) 04/17/2019   MCV 82.8 04/17/2019   PLT 426 (H) 04/17/2019    Lab Results  Component Value Date   CREATININE 0.70 04/17/2019   BUN 7 04/17/2019   NA 139 04/17/2019   K 3.4 (L) 04/17/2019   CL 108 04/17/2019   CO2 27 04/17/2019    Lab Results  Component Value Date   ALT 11 04/17/2019   AST 30 04/17/2019   ALKPHOS 70 04/17/2019     Microbiology: Recent Results (from the past 240 hour(s))  Culture, blood (routine x 2)     Status: None   Collection Time: 04/08/19  1:19 AM   Specimen: BLOOD  Result Value Ref Range Status   Specimen Description BLOOD RIGHT ANTECUBITAL  Final   Special Requests   Final    BOTTLES DRAWN AEROBIC AND ANAEROBIC Blood Culture results may not be optimal due to an excessive volume of blood received in culture bottles   Culture   Final    NO GROWTH 5 DAYS Performed at [facility], [address], [place], [place] [address]     Report Status 04/13/2019 FINAL  Final  Culture, blood (routine x 2)     Status: None   Collection Time: 04/08/19  1:19 AM   Specimen: BLOOD  Result Value Ref Range Status   Specimen Description BLOOD RIGHT ANTECUBITAL  Final   Special Requests   Final    BOTTLES DRAWN AEROBIC AND ANAEROBIC Blood Culture adequate volume   Culture   Final    NO GROWTH 5 DAYS Performed at [facility], [address], [place], [place] [address]    Report Status 04/13/2019 FINAL  Final  SARS CORONAVIRUS 2 (TAT 6-24 HRS) Nasopharyngeal Nasopharyngeal Swab     Status: None   Collection Time: 04/13/19 12:43 PM   Specimen: Nasopharyngeal Swab  Result Value Ref Range Status   SARS Coronavirus 2 NEGATIVE NEGATIVE Final    Comment: (NOTE) SARS-CoV-2 target nucleic acids are NOT DETECTED. The SARS-CoV-2 RNA is generally detectable in upper and lower respiratory specimens during the acute phase of infection. Negative results do not preclude SARS-CoV-2 infection, do not rule out co-infections with other pathogens, and should not be used as the sole basis for treatment or other patient management decisions. Negative results must be combined with clinical observations, patient history, and epidemiological information. The expected result is Negative. Fact Sheet for Patients: HairSlick.no Fact Sheet for Healthcare Providers: quierodirigir.com This  test is not yet approved or cleared by the [place] and  has been authorized for detection and/or diagnosis of SARS-CoV-2 by FDA under an Emergency Use Authorization (EUA). This EUA will remain  in effect (meaning this test can be used) for the duration of the COVID-19 declaration under Section 56 4(b)(1) of the Act, 21 U.S.C. section 360bbb-3(b)(1), unless the authorization is terminated or revoked sooner. Performed at [facility], [place] [address], [place], [place] [address]      Impression/Plan:  1. MRSA bacteremia - on vancomycin and repeat blood cultures with clearance.  Needs TEE to rule out endocarditis but not getting it done here.  Still awaiting transfer to [place].   Will need 6 weeks of IV vancomycin if no TEE done through 05/19/19, if TEE done and negative for vegetation, can stop 11/13.    2.  Access - now with picc line.    3.  Pancreatic pseudocyst - on ceftriaxone and flagyl.  Will change to oral Augmentin.  I would treat another 10 days.    Will follow up intermittently

## 2019-04-17 NOTE — Progress Notes (Signed)
PROGRESS NOTE  Charles Calhoun WUX:[id number] DOB: [birth year] DOA: 04/06/2019 PCP: System, Pcp Not In  HPI/Recap of past 49 hours: 48 year old male with history of alcohol abuse also potential alcoholic pancreatitis who was admitted with possible infected pancreatic pseudocyst is transferred from another facility where he presented with nausea weakness and fever and abdominal pain Subjective : Patient seen and examined at bedside he is doing much better he does still complain of some pain in the left side he is currently on clear liquids started on full liquid is still on TPN.  He is awaiting transfer back to his primary GI doctor at [facility] clinic in [place] his NG tube was removed yesterday.  Subjective April 16, 2019: Patient seen and examined at bedside he is doing very well he ate his full liquid diet and tolerated it well denies abdominal pain  Subjective April 17, 2019: Patient seen and examined at bedside.  He is doing very well he tolerated his full liquid well speech therapy has evaluated and recommended that we increase his feeding to low residue diet which she will get a tray this afternoon.  Also we starting to wean him off TPN  Assessment/Plan: Principal Problem:   MRSA bacteremia Active Problems:   Sepsis (HCC)   Pancreatitis   Pancreatic pseudocyst   Cirrhosis (HCC)   Diabetes mellitus (HCC)   Alcohol abuse  1.  Sepsis with MRSA bacteremia, resolved, is on IV vancomycin ceftriaxone and Flagyl.  Cultures are negative.  He has been afebrile overall begin to de-escalate the antibiotics per pharmacy  2.  Ileus and gastric outlet obstruction likely due to enlarging part of pancreatic pseudocyst, NG tube was removed he is on full liquids now still getting TPN will begin to wean him off TPN  3.  Electrolyte imbalance slowly improving  4.  History of alcohol abuse last drink was 2 months ago he is on TPN and IV thiamine  5.  Hypertension continue current management  6.  Sinus tachycardia improving is on telemetry monitor  7.  Pancreatic pseudocyst still getting TPN.  GI recommended to advance diet to low residue low-fat diet if he continues to tolerate liquids and also to avoid opiates.  We will advance his diet today  8 .hypokalemia.  Patient is on TPN potassium content in TPN will be adjusted by pharmacy  9.  Severe protein calorie malnutrition with hypoalbuminemia.  Patient will continue TPN  Code Status: FULL   Severity of Illness: The appropriate patient status for this patient is INPATIENT. Inpatient status is judged to be reasonable and necessary in order to provide the required intensity of service to ensure the patient's safety. The patient's presenting symptoms, physical exam findings, and initial radiographic and laboratory data in the context of their chronic comorbidities is felt to place them at high risk for further clinical deterioration. Furthermore, it is not anticipated that the patient will be medically stable for discharge from the hospital within 2 midnights of admission. The following factors support the patient status of inpatient.   " The patient's presenting symptoms include  Abdominal pain with pancreatic pseudocyst " The worrisome physical exam findings include he is still on TPN and awaiting transfer " The initial radiographic and laboratory data are worrisome because pancreatic pseudocyst. " The chronic co-morbidities include alcohol abuse.   * I certify that at the point of admission it is my clinical judgment that the patient will require inpatient hospital care spanning beyond 2 midnights from the point of admission  due to high intensity of service, high risk for further deterioration and high frequency of surveillance required.   Barrier to discharge awaiting transfer back to [place]    Family Communication: Patient  Disposition Plan: Home   Consultants:  Infectious disease  GI  Procedures:  PICC line  placement and removal  Antimicrobials:  Ceftriaxone metronidazole and vancomycin  DVT prophylaxis: Lovenox   Objective: Vitals:   04/17/19 0036 04/17/19 0541 04/17/19 0914 04/17/19 1302  BP: (!) 128/93 (!) 130/91 (!) 134/96 (!) 149/107  Pulse: (!) 108 (!) 105 (!) 103 (!) 103  Resp: 18 20  18   Temp: 98.1 F (36.7 C) 98.4 F (36.9 C) 98.5 F (36.9 C) 98.2 F (36.8 C)  TempSrc: Oral Oral Oral Oral  SpO2: 100% 100% 99% 99%  Weight: 93.7 kg     Height:        Intake/Output Summary (Last 24 hours) at 04/17/2019 1744 Last data filed at 04/17/2019 1300 Gross per 24 hour  Intake 2622.97 ml  Output 2250 ml  Net 372.97 ml   Filed Weights   04/15/19 0138 04/16/19 0602 04/17/19 0036  Weight: 87.4 kg 91.6 kg 93.7 kg   Body mass index is 28.02 kg/m.  Exam:  . General: 48 y.o. year-old male well developed well nourished in no acute distress.  Alert and oriented x3. . Cardiovascular: Regular rate and rhythm with no rubs or gallops.  No thyromegaly or JVD noted.   44 Respiratory: Clear to auscultation with no wheezes or rales. Good inspiratory effort. . Abdomen: Soft nontender ,mildly distended with normal bowel sounds x4 quadrants. . Musculoskeletal: No lower extremity edema. 2/4 pulses in all 4 extremities. . Skin: No ulcerative lesions noted or rashes, . Psychiatry: Mood is appropriate for condition and setting    Data Reviewed: CBC: Recent Labs  Lab 04/10/19 1832 04/12/19 0356 04/17/19 0613 04/17/19 0843  WBC 10.0 9.1 7.8 7.9  NEUTROABS  --   --  5.6 5.8  HGB 7.3* 7.2* 7.0* 7.0*  HCT 24.1* 24.0* 24.1* 24.1*  MCV 78.8* 79.2* 82.0 82.8  PLT 311 475* 457* 426*   Basic Metabolic Panel: Recent Labs  Lab 04/13/19 0608 04/14/19 0801 04/15/19 0413 04/16/19 0607 04/17/19 0613  NA 141 139 139 139 139  K 3.3* 4.9 2.8* 3.1* 3.4*  CL 112* 108 110 107 108  CO2 22 23 24 26 27   GLUCOSE 119* 571* 124* 115* 128*  BUN <5* <5* <5* 6 7  CREATININE 0.72 0.75 0.79 0.60* 0.70   CALCIUM 7.5* 7.7* 7.4* 7.4* 7.6*  MG 1.5* 2.7* 1.5* 1.6* 1.7  PHOS 3.2 4.5 3.0 2.7 2.5   GFR: Estimated Creatinine Clearance: 131.3 mL/min (by C-G formula based on SCr of 0.7 mg/dL). Liver Function Tests: Recent Labs  Lab 04/13/19 0608 04/17/19 0613  AST 22 30  ALT 20 11  ALKPHOS 78 70  BILITOT 0.7 0.5  PROT 6.2* 6.4*  ALBUMIN 1.4* 1.3*   No results for input(s): LIPASE, AMYLASE in the last 168 hours. No results for input(s): AMMONIA in the last 168 hours. Coagulation Profile: No results for input(s): INR, PROTIME in the last 168 hours. Cardiac Enzymes: No results for input(s): CKTOTAL, CKMB, CKMBINDEX, TROPONINI in the last 168 hours. BNP (last 3 results) No results for input(s): PROBNP in the last 8760 hours. HbA1C: No results for input(s): HGBA1C in the last 72 hours. CBG: Recent Labs  Lab 04/16/19 1717 04/16/19 2351 04/17/19 0550 04/17/19 1137 04/17/19 1632  GLUCAP 148* 129* 130*  133* 114*   Lipid Profile: Recent Labs    04/17/19 0613  TRIG 55   Thyroid Function Tests: No results for input(s): TSH, T4TOTAL, FREET4, T3FREE, THYROIDAB in the last 72 hours. Anemia Panel: No results for input(s): VITAMINB12, FOLATE, FERRITIN, TIBC, IRON, RETICCTPCT in the last 72 hours. Urine analysis: No results found for: COLORURINE, APPEARANCEUR, LABSPEC, PHURINE, GLUCOSEU, HGBUR, BILIRUBINUR, KETONESUR, PROTEINUR, UROBILINOGEN, NITRITE, LEUKOCYTESUR Sepsis Labs: @LABRCNTIP (procalcitonin:4,lacticidven:4)  ) Recent Results (from the past 240 hour(s))  Culture, blood (routine x 2)     Status: None   Collection Time: 04/08/19  1:19 AM   Specimen: BLOOD  Result Value Ref Range Status   Specimen Description BLOOD RIGHT ANTECUBITAL  Final   Special Requests   Final    BOTTLES DRAWN AEROBIC AND ANAEROBIC Blood Culture results may not be optimal due to an excessive volume of blood received in culture bottles   Culture   Final    NO GROWTH 5 DAYS Performed at [facility], [address] [place], [place] [address]    Report Status 04/13/2019 FINAL  Final  Culture, blood (routine x 2)     Status: None   Collection Time: 04/08/19  1:19 AM   Specimen: BLOOD  Result Value Ref Range Status   Specimen Description BLOOD RIGHT ANTECUBITAL  Final   Special Requests   Final    BOTTLES DRAWN AEROBIC AND ANAEROBIC Blood Culture adequate volume   Culture   Final    NO GROWTH 5 DAYS Performed at [facility], [address] [place], [place] [address]    Report Status 04/13/2019 FINAL  Final  SARS CORONAVIRUS 2 (TAT 6-24 HRS) Nasopharyngeal Nasopharyngeal Swab     Status: None   Collection Time: 04/13/19 12:43 PM   Specimen: Nasopharyngeal Swab  Result Value Ref Range Status   SARS Coronavirus 2 NEGATIVE NEGATIVE Final    Comment: (NOTE) SARS-CoV-2 target nucleic acids are NOT DETECTED. The SARS-CoV-2 RNA is generally detectable in upper and lower respiratory specimens during the acute phase of infection. Negative results do not preclude SARS-CoV-2 infection, do not rule out co-infections with other pathogens, and should not be used as the sole basis for treatment or other patient management decisions. Negative results must be combined with clinical observations, patient history, and epidemiological information. The expected result is Negative. Fact Sheet for Patients: HairSlick.nohttps://www.fda.gov/media/138098/download Fact Sheet for Healthcare Providers: quierodirigir.comhttps://www.fda.gov/media/138095/download This test is not yet approved or cleared by the [place] FDA and  has been authorized for detection and/or diagnosis of SARS-CoV-2 by FDA under an Emergency Use Authorization (EUA). This EUA will remain  in effect (meaning this test can be used) for the duration of the COVID-19 declaration under Section 56 4(b)(1) of the Act, 21 U.S.C. section 360bbb-3(b)(1), unless the authorization is terminated or revoked sooner. Performed at [facility],  [address] [place], [place] [address]       Studies: No results found.  Scheduled Meds: . amoxicillin-clavulanate  1 tablet Oral Q12H  . Chlorhexidine Gluconate Cloth  6 each Topical Daily  . Chlorhexidine Gluconate Cloth  6 each Topical Daily  . enoxaparin (LOVENOX) injection  40 mg Subcutaneous Q24H  . feeding supplement (ENSURE ENLIVE)  237 mL Oral BID BM  . [START ON 04/19/2019] folic acid  1 mg Oral Daily  . insulin aspart  0-9 Units Subcutaneous Q6H  . metoprolol succinate  25 mg Oral Daily  . [START ON 04/19/2019] multivitamin with minerals  1 tablet Oral Daily  . mupirocin ointment   Nasal BID  . pantoprazole (PROTONIX) IV  40 mg Intravenous Q12H  . sodium chloride flush  10-40 mL Intracatheter Q12H  . [START ON 04/19/2019] thiamine  100 mg Oral Daily    Continuous Infusions: . sodium chloride 20 mL/hr at 04/17/19 1230  . TPN ADULT (ION) 95 mL/hr at 04/16/19 1910  . TPN ADULT (ION)    . vancomycin 1,500 mg (04/17/19 1232)     LOS: 11 days     Cristal Deer, MD [facility]  To reach me or the doctor on call, go to: [email or web address] Password [facility]  04/17/2019, 5:44 PM

## 2019-04-17 NOTE — Progress Notes (Signed)
Nutrition Follow-up  DOCUMENTATION CODES:   Not applicable  INTERVENTION:   -TPN management per pharmacy, plan to d/c today per discussion with pharmacy -D/c Boost Breeze po TID, each supplement provides 250 kcal and 9 grams of protein -Ensure Enlive po BID, each supplement provides 350 kcal and 20 grams of protein -Magic cup TID with meals, each supplement provides 290 kcal and 9 grams of protein -MVI with minerals daily  NUTRITION DIAGNOSIS:   Increased nutrient needs related to acute illness as evidenced by estimated needs.  Ongoing  GOAL:   Patient will meet greater than or equal to 90% of their needs  Progressing   MONITOR:   PO intake, Supplement acceptance, Labs, Weight trends, Skin, I & O's  REASON FOR ASSESSMENT:   Consult New TPN/TNA  ASSESSMENT:   48 yo male admitted from [facility] with sepsis, enlarging pancreatic pseudocysts. PMH includes alcoholic pancreatitis, DM, pancreatic pseudocyst (receiving home TPN since 03/04/2019).  10/31- PICC pulled due to bacteremia 11/2- NGT d/c 11/3- transferred from ICU to PCU 11/4- PICC placed, TPN re-started 11/5- NGT clamped 11/6- NGT removed, advanced to clear liquid diet 11/8- advanced to soft diet  Reviewed I/O's: +5.2 L x 24 hours and +12 L since admission  UOP: 2.3 L x 24 hours  Attempted to speak with pt via phone, however, no answer.   Pt tolerating soft diet well. Noted meal completion 50-100%. Pt refused last Boost Breeze supplement. Given increased nutrient needs, pt would benefit from continued addition of oral nutrition supplements.   Case discussed with pharmacist, who reports plan to wean TPN today.   Plan to continue IV antibiotics. Pt is awaiting transfer back to [facility] in [place].  Labs reviewed: K: 3.4. Mg and Phos WDL. CBGS: 129-148 (inpatient orders for glycemic control are 0-9 units insulin aspart every 6 hours).   Diet Order:   Diet Order            DIET SOFT Room service  appropriate? Yes; Fluid consistency: Thin  Diet effective now              EDUCATION NEEDS:   Not appropriate for education at this time  Skin:  Skin Assessment: Reviewed RN Assessment  Last BM:  04/16/19  Height:   Ht Readings from Last 1 Encounters:  04/10/19 6' (1.829 m)    Weight:   Wt Readings from Last 1 Encounters:  04/17/19 93.7 kg    Ideal Body Weight:  80.9 kg  BMI:  Body mass index is 28.02 kg/m.  Estimated Nutritional Needs:   Kcal:  2500-2700  Protein:  125-135 gm  Fluid:  >/= 2.4 L    Charles Calhoun, RD, LDN, [place] Registered Dietitian II Certified Diabetes Care and Education Specialist Pager: [phone] After hours Pager: [phone]

## 2019-04-17 NOTE — Plan of Care (Signed)
  Problem: Skin Integrity: Goal: Risk for impaired skin integrity will decrease Outcome: Completed/Met

## 2019-04-17 NOTE — Progress Notes (Signed)
PHARMACY - ADULT TOTAL PARENTERAL NUTRITION CONSULT NOTE  Pharmacy Consult:  TPN Indication:  Pancreatic pseudocyst with GOO  Patient Measurements: Height: 6' (182.9 cm) Weight: 206 lb 9.6 oz (93.7 kg) IBW/kg (Calculated) : 77.6 TPN AdjBW (KG): 85.1 Body mass index is 28.02 kg/m.  Assessment:  45 YOM presented with abdominal pain and vomiting. He has a history of EtOH abuse and cirrhosis.  Found to have pancreatic pseudocyst, decision was made at outside hospital to treat patient conservatively with NPO status and TPN for 6 weeks. TPN was initiated on 03/04/19 with potential end date of 04/15/19. Patient's care has been at [facility] in [place], [place]. Patient was transferred to [facility] on 10/29 after becoming septic and finding that the pseudocysts have become enlarged. TPN held for 1 week due to no central line; patient has been on D5W 50 ml/hr while TPN was held, is at lower risk for refeeding.  GI: prealbumin < 5, NGT pulled. PPI IV BID; LBM 11/7 Endo: no hx DM - CBGs well controlled  Insulin requirements in the past 24 hours: 3 units SSI Lytes: K 3.4, Mg 1.7 Renal: SCr stable, BUN low- UOP 0.9 ml/kg/hr Pulm: RA Cards: BP controlled, tachy 105-118 on metoprolol Hepatobil: LFTs / tbili WNL, TG 77  Neuro: PRN Dilaudid ID: Vanc/CTX/Flagyl for MRSA bacteremia + IAI. 10/31>>    ;BCx neg;  Afebrile, WBC WNL  TPN Access: PICC pulled for bacteremia, new PICC 11/4 TPN start date: 11/4  Nutritional Goals (per RD rec on 10/30): 2500-2700 kCal, 125-135gm protein, >2.4L fluid per day  Home TPN formula: [facility] in [place] [phone] 1886 kCal, 100gm protein (CHO 290g, ILE 50g), 2272mL over 12 hrs, thiamine 100mg   Current Nutrition:  Tolerating soft diet from 11/8  Plan:  Wean TPN to off 11/10 pm - will give 40 mL/hr x 24 h then stop per Dr Kyung Bacca Electrolytes in TPN increase Continue as previous   Daily MVI, TE, folate and thiamine in TPN Mag 2 grams IV x 1 KCL 10 meq IV x 2 Add po thiamine,  folic acid, and MVI to begin wednesday  Barth Kirks, PharmD, BCPS, BCCCP Clinical Pharmacist [phone]  Please check AMION for all [place] numbers  04/17/2019 8:33 AM

## 2019-04-18 LAB — GLUCOSE, CAPILLARY
Glucose-Capillary: 102 mg/dL — ABNORMAL HIGH (ref 70–99)
Glucose-Capillary: 104 mg/dL — ABNORMAL HIGH (ref 70–99)
Glucose-Capillary: 112 mg/dL — ABNORMAL HIGH (ref 70–99)
Glucose-Capillary: 116 mg/dL — ABNORMAL HIGH (ref 70–99)
Glucose-Capillary: 198 mg/dL — ABNORMAL HIGH (ref 70–99)
Glucose-Capillary: 89 mg/dL (ref 70–99)

## 2019-04-18 LAB — CBC
HCT: 23.3 % — ABNORMAL LOW (ref 39.0–52.0)
Hemoglobin: 7 g/dL — ABNORMAL LOW (ref 13.0–17.0)
MCH: 24.4 pg — ABNORMAL LOW (ref 26.0–34.0)
MCHC: 30 g/dL (ref 30.0–36.0)
MCV: 81.2 fL (ref 80.0–100.0)
Platelets: 384 10*3/uL (ref 150–400)
RBC: 2.87 MIL/uL — ABNORMAL LOW (ref 4.22–5.81)
RDW: 20.2 % — ABNORMAL HIGH (ref 11.5–15.5)
WBC: 8.1 10*3/uL (ref 4.0–10.5)
nRBC: 0 % (ref 0.0–0.2)

## 2019-04-18 LAB — VANCOMYCIN, TROUGH: Vancomycin Tr: 19 ug/mL (ref 15–20)

## 2019-04-18 MED ORDER — VANCOMYCIN HCL IN DEXTROSE 1-5 GM/200ML-% IV SOLN
1000.0000 mg | Freq: Two times a day (BID) | INTRAVENOUS | Status: DC
  Administered 2019-04-18 – 2019-04-22 (×8): 1000 mg via INTRAVENOUS
  Filled 2019-04-18 (×9): qty 200

## 2019-04-18 NOTE — TOC Progression Note (Signed)
Transition of Care [facility]) - Progression Note    Patient Details  Name: Charles Calhoun MRN: [id number] Date of Birth: 10-Oct-1970  Transition of Care [facility]) CM/SW Contact  Zenon Mayo, RN Phone Number: 04/18/2019, 7:59 PM  Clinical Narrative:    Patient  is on chonic TPN and ID is following for iv abx, patient is from [facility] in [place].  Patient states he is going back to [place], states his girlfriend will transport him.  He has medicaid of [place] and his meds will be covered in [place] with his medicaid.  The procedure that he is suppose to have is not covered here under his insurance so he will be going back to [place] , MD will work on finding accepting MD .  He states he has two bags of TPN in his frigerator and the RNs with [facility] is working with him.  His TPN is delivered to his home by [facility].  TOC  Team will follow for dc needs.      Barriers to Discharge: Other (comment)(awaiting bed for acute to acute transfer)  Expected Discharge Plan and Services                                                 Social Determinants of Health (SDOH) Interventions    Readmission Risk Interventions No flowsheet data found.

## 2019-04-18 NOTE — Progress Notes (Addendum)
PROGRESS NOTE  Charles Calhoun FVC:[id number] DOB: [birth year] DOA: 04/06/2019 PCP: System, Pcp Not In  HPI/Recap of past 57 hours: 48 year old male with history of alcohol abuse also potential alcoholic pancreatitis who was admitted with possible infected pancreatic pseudocyst is transferred from another facility where he presented with nausea weakness and fever and abdominal pain Subjective : Patient seen and examined at bedside he is doing much better he does still complain of some pain in the left side he is currently on clear liquids started on full liquid is still on TPN.  He is awaiting transfer back to his primary GI doctor at [facility] in [place] his NG tube was removed yesterday.  Subjective April 16, 2019: Patient seen and examined at bedside he is doing very well he ate his full liquid diet and tolerated it well denies abdominal pain  Subjective April 17, 2019: Patient seen and examined at bedside.  He is doing very well he tolerated his full liquid well speech therapy has evaluated and recommended that we increase his feeding to low residue diet which she will get a tray this afternoon.  Also we starting to wean him off TPN  Subjective April 18, 2019: Patient seen and examined at bedside was started on soft diet yesterday he is tolerating it well.  His TPN is being weaned off.  Infectious disease is still following and they have some recommendation below  Assessment/Plan: Principal Problem:   MRSA bacteremia Active Problems:   Sepsis (HCC)   Pancreatitis   Pancreatic pseudocyst   Cirrhosis (HCC)   Diabetes mellitus (HCC)   Alcohol abuse  1.  Sepsis with MRSA bacteremia, resolved, is on IV vancomycin ceftriaxone and Flagyl.  Cultures are negative.  He has been afebrile overall begin to de-escalate the antibiotics per pharmacy PER ID: MRSA bacteremia - on vancomycin and repeat blood cultures with clearance.  Needs TEE to rule out endocarditis but not getting it  done here.  Still awaiting transfer to [place].   Will need 6 weeks of IV vancomycin if no TEE done through 05/19/19, if TEE done and negative for vegetation, can stop 11/13.    PICC line is still in place.   I have ordered TEE to be done   2.  Ileus and gastric outlet obstruction likely due to enlarging part of pancreatic pseudocyst, NG tube was removed he is on low residue oral diet now still getting TPN will   wean him off TPN  3.  Electrolyte imbalance slowly improving  4.  History of alcohol abuse last drink was 2 months ago he is on TPN and IV thiamine  5.  Hypertension continue current management  6.  Sinus tachycardia improving is on telemetry monitor  7.  Pancreatic pseudocyst still getting TPN.  GI recommended to advance diet to low residue low-fat diet if he continues to tolerate liquids and also to avoid opiates.  We will advance his diet today  8 .hypokalemia.  Patient is on TPN potassium content in TPN will be adjusted by pharmacy  9.  Severe protein calorie malnutrition with hypoalbuminemia.  Patient being weaned off TPN ,he is not taking orally  Code Status: FULL   Severity of Illness: The appropriate patient status for this patient is INPATIENT. Inpatient status is judged to be reasonable and necessary in order to provide the required intensity of service to ensure the patient's safety. The patient's presenting symptoms, physical exam findings, and initial radiographic and laboratory data in the context of their  chronic comorbidities is felt to place them at high risk for further clinical deterioration. Furthermore, it is not anticipated that the patient will be medically stable for discharge from the hospital within 2 midnights of admission. The following factors support the patient status of inpatient.   " The patient's presenting symptoms include  Abdominal pain with pancreatic pseudocyst " The worrisome physical exam findings include he is still on TPN and awaiting  transfer " The initial radiographic and laboratory data are worrisome because pancreatic pseudocyst MRSA bacteremia. " The chronic co-morbidities include alcohol abuse.   * I certify that at the point of admission it is my clinical judgment that the patient will require inpatient hospital care spanning beyond 2 midnights from the point of admission due to high intensity of service, high risk for further deterioration and high frequency of surveillance required.   Barrier to discharge awaiting transfer back to [place]    Family Communication: Patient  Disposition Plan: Home when stable in the next 2 to 3 days patient is supposed to go back to [place] to follow-up with [facility]   Consultants:  Infectious disease  GI  Procedures:  PICC line placement and removal  Antimicrobials:  Ceftriaxone metronidazole and vancomycin  DVT prophylaxis: Lovenox   Objective: Vitals:   04/18/19 0312 04/18/19 0633 04/18/19 0700 04/18/19 0853  BP: (!) 127/92 (!) 142/100 (!) 142/99 (!) 146/99  Pulse: (!) 113 (!) 103  (!) 109  Resp: 18     Temp: 98.7 F (37.1 C)     TempSrc: Oral     SpO2: 100%     Weight:      Height:        Intake/Output Summary (Last 24 hours) at 04/18/2019 0959 Last data filed at 04/18/2019 [id number] Gross per 24 hour  Intake 4436.54 ml  Output 1951 ml  Net 2485.54 ml   Filed Weights   04/16/19 0602 04/17/19 0036 04/18/19 0144  Weight: 91.6 kg 93.7 kg 95.7 kg   Body mass index is 28.6 kg/m.  Exam:  . General: 48 y.o. year-old male well developed well nourished in no acute distress.  Alert and oriented x3. . Cardiovascular: Regular rate and rhythm with no rubs or gallops.  No thyromegaly or JVD noted.   [name]. Respiratory: Clear to auscultation with no wheezes or rales. Good inspiratory effort. . Abdomen: Soft nontender ,mildly distended with normal bowel sounds x4 quadrants. . Musculoskeletal: No lower extremity edema. 2/4 pulses in all 4 extremities. .  Skin: No ulcerative lesions noted or rashes, . Psychiatry: Mood is appropriate for condition and setting    Data Reviewed: CBC: Recent Labs  Lab 04/12/19 0356 04/17/19 0613 04/17/19 0843 04/18/19 0315  WBC 9.1 7.8 7.9 8.1  NEUTROABS  --  5.6 5.8  --   HGB 7.2* 7.0* 7.0* 7.0*  HCT 24.0* 24.1* 24.1* 23.3*  MCV 79.2* 82.0 82.8 81.2  PLT 475* 457* 426* 384   Basic Metabolic Panel: Recent Labs  Lab 04/13/19 0608 04/14/19 0801 04/15/19 0413 04/16/19 0607 04/17/19 0613  NA 141 139 139 139 139  K 3.3* 4.9 2.8* 3.1* 3.4*  CL 112* 108 110 107 108  CO2 22 23 24 26 27   GLUCOSE 119* 571* 124* 115* 128*  BUN <5* <5* <5* 6 7  CREATININE 0.72 0.75 0.79 0.60* 0.70  CALCIUM 7.5* 7.7* 7.4* 7.4* 7.6*  MG 1.5* 2.7* 1.5* 1.6* 1.7  PHOS 3.2 4.5 3.0 2.7 2.5   GFR: Estimated Creatinine Clearance: 132.5 mL/min (by  C-G formula based on SCr of 0.7 mg/dL). Liver Function Tests: Recent Labs  Lab 04/13/19 0608 04/17/19 0613  AST 22 30  ALT 20 11  ALKPHOS 78 70  BILITOT 0.7 0.5  PROT 6.2* 6.4*  ALBUMIN 1.4* 1.3*   No results for input(s): LIPASE, AMYLASE in the last 168 hours. No results for input(s): AMMONIA in the last 168 hours. Coagulation Profile: No results for input(s): INR, PROTIME in the last 168 hours. Cardiac Enzymes: No results for input(s): CKTOTAL, CKMB, CKMBINDEX, TROPONINI in the last 168 hours. BNP (last 3 results) No results for input(s): PROBNP in the last 8760 hours. HbA1C: No results for input(s): HGBA1C in the last 72 hours. CBG: Recent Labs  Lab 04/17/19 0550 04/17/19 1137 04/17/19 1632 04/18/19 0018 04/18/19 0609  GLUCAP 130* 133* 114* 112* 89   Lipid Profile: Recent Labs    04/17/19 0613  TRIG 55   Thyroid Function Tests: No results for input(s): TSH, T4TOTAL, FREET4, T3FREE, THYROIDAB in the last 72 hours. Anemia Panel: No results for input(s): VITAMINB12, FOLATE, FERRITIN, TIBC, IRON, RETICCTPCT in the last 72 hours. Urine analysis: No  results found for: COLORURINE, APPEARANCEUR, LABSPEC, PHURINE, GLUCOSEU, HGBUR, BILIRUBINUR, KETONESUR, PROTEINUR, UROBILINOGEN, NITRITE, LEUKOCYTESUR Sepsis Labs: @LABRCNTIP (procalcitonin:4,lacticidven:4)  ) Recent Results (from the past 240 hour(s))  SARS CORONAVIRUS 2 (TAT 6-24 HRS) Nasopharyngeal Nasopharyngeal Swab     Status: None   Collection Time: 04/13/19 12:43 PM   Specimen: Nasopharyngeal Swab  Result Value Ref Range Status   SARS Coronavirus 2 NEGATIVE NEGATIVE Final    Comment: (NOTE) SARS-CoV-2 target nucleic acids are NOT DETECTED. The SARS-CoV-2 RNA is generally detectable in upper and lower respiratory specimens during the acute phase of infection. Negative results do not preclude SARS-CoV-2 infection, do not rule out co-infections with other pathogens, and should not be used as the sole basis for treatment or other patient management decisions. Negative results must be combined with clinical observations, patient history, and epidemiological information. The expected result is Negative. Fact Sheet for Patients: SugarRoll.be Fact Sheet for Healthcare Providers: https://www.woods-mathews.com/ This test is not yet approved or cleared by the [place] FDA and  has been authorized for detection and/or diagnosis of SARS-CoV-2 by FDA under an Emergency Use Authorization (EUA). This EUA will remain  in effect (meaning this test can be used) for the duration of the COVID-19 declaration under Section 56 4(b)(1) of the Act, 21 U.S.C. section 360bbb-3(b)(1), unless the authorization is terminated or revoked sooner. Performed at [facility], [place] [address], [place], [place] [address]       Studies: No results found.  Scheduled Meds: . amoxicillin-clavulanate  1 tablet Oral Q12H  . Chlorhexidine Gluconate Cloth  6 each Topical Daily  . Chlorhexidine Gluconate Cloth  6 each Topical Daily  . enoxaparin (LOVENOX)  injection  40 mg Subcutaneous Q24H  . feeding supplement (ENSURE ENLIVE)  237 mL Oral BID BM  . [START ON 86/57/8469] folic acid  1 mg Oral Daily  . insulin aspart  0-9 Units Subcutaneous Q6H  . metoprolol succinate  25 mg Oral Daily  . [START ON 04/19/2019] multivitamin with minerals  1 tablet Oral Daily  . mupirocin ointment   Nasal BID  . pantoprazole (PROTONIX) IV  40 mg Intravenous Q12H  . sodium chloride flush  10-40 mL Intracatheter Q12H  . [START ON 04/19/2019] thiamine  100 mg Oral Daily    Continuous Infusions: . sodium chloride 20 mL/hr at 04/17/19 1230  . TPN ADULT (  ION) 40 mL/hr at 04/17/19 1845  . vancomycin       LOS: 12 days     Myrtie Neither, MD [facility]  To reach me or the doctor on call, go to: [email or web address] Password [email or web address]  04/18/2019, 9:59 AM

## 2019-04-18 NOTE — Progress Notes (Signed)
Pharmacy Antibiotic Note  Charles Calhoun is a 48 y.o. male with MRSA bacteremia. Pharmacy is consulted for vancomycin.   Vancomycin levels obtained 11/9 and AUC above goal   Plan: Change Vancomycin 1 g IV q12h Est AUC 430  Height: 6' (182.9 cm) Weight: 206 lb 9.6 oz (93.7 kg) IBW/kg (Calculated) : 77.6  Temp (24hrs), Avg:98.5 F (36.9 C), Min:98.1 F (36.7 C), Max:99.1 F (37.3 C)  Recent Labs  Lab 04/12/19 0356 04/13/19 0608 04/14/19 0801 04/15/19 0413 04/16/19 0607 04/17/19 0613 04/17/19 0843 04/17/19 1620 04/17/19 2216  WBC 9.1  --   --   --   --  7.8 7.9  --   --   CREATININE  --  0.72 0.75 0.79 0.60* 0.70  --   --   --   VANCOTROUGH  --   --   --   --   --   --   --   --  19  VANCOPEAK  --   --   --   --   --   --   --  34  --     Estimated Creatinine Clearance: 131.3 mL/min (by C-G formula based on SCr of 0.7 mg/dL).    No Known Allergies  Antimicrobials this admission: Meropenem 10/29 >> 10/30 Zosyn 10/29 at OSH  Vanc 10/29 >>  CTX 10/30>> Flagyl 10/30>>  11/2:  VT pk 29, VT 12, calc AUC 524 - continue current dose  Microbiology results: 10/31: BC - ngtd OSH: 3/4 BC positive for GPC  10/30 Ucx: neg 10/30 Bcx: MRSA 10/29 MRSA PCR positive    Phillis Knack, PharmD, BCPS

## 2019-04-19 LAB — CBC WITH DIFFERENTIAL/PLATELET
Abs Immature Granulocytes: 0.03 10*3/uL (ref 0.00–0.07)
Basophils Absolute: 0.1 10*3/uL (ref 0.0–0.1)
Basophils Relative: 1 %
Eosinophils Absolute: 0.1 10*3/uL (ref 0.0–0.5)
Eosinophils Relative: 1 %
HCT: 23.1 % — ABNORMAL LOW (ref 39.0–52.0)
Hemoglobin: 6.8 g/dL — CL (ref 13.0–17.0)
Immature Granulocytes: 1 %
Lymphocytes Relative: 18 %
Lymphs Abs: 1.1 10*3/uL (ref 0.7–4.0)
MCH: 23.5 pg — ABNORMAL LOW (ref 26.0–34.0)
MCHC: 29.4 g/dL — ABNORMAL LOW (ref 30.0–36.0)
MCV: 79.9 fL — ABNORMAL LOW (ref 80.0–100.0)
Monocytes Absolute: 0.7 10*3/uL (ref 0.1–1.0)
Monocytes Relative: 11 %
Neutro Abs: 4.4 10*3/uL (ref 1.7–7.7)
Neutrophils Relative %: 68 %
Platelets: 375 10*3/uL (ref 150–400)
RBC: 2.89 MIL/uL — ABNORMAL LOW (ref 4.22–5.81)
RDW: 20.2 % — ABNORMAL HIGH (ref 11.5–15.5)
WBC: 6.4 10*3/uL (ref 4.0–10.5)
nRBC: 0 % (ref 0.0–0.2)

## 2019-04-19 LAB — GLUCOSE, CAPILLARY
Glucose-Capillary: 87 mg/dL (ref 70–99)
Glucose-Capillary: 91 mg/dL (ref 70–99)
Glucose-Capillary: 98 mg/dL (ref 70–99)

## 2019-04-19 LAB — RENAL FUNCTION PANEL
Albumin: 1.4 g/dL — ABNORMAL LOW (ref 3.5–5.0)
Anion gap: 5 (ref 5–15)
BUN: 6 mg/dL (ref 6–20)
CO2: 25 mmol/L (ref 22–32)
Calcium: 7.7 mg/dL — ABNORMAL LOW (ref 8.9–10.3)
Chloride: 108 mmol/L (ref 98–111)
Creatinine, Ser: 0.67 mg/dL (ref 0.61–1.24)
GFR calc Af Amer: >60 mL/min (ref >60)
GFR calc non Af Amer: >60 mL/min (ref >60)
Glucose, Bld: 97 mg/dL (ref 70–99)
Phosphorus: 3.4 mg/dL (ref 2.5–4.6)
Potassium: 3.7 mmol/L (ref 3.5–5.1)
Sodium: 138 mmol/L (ref 135–145)

## 2019-04-19 LAB — ABO/RH: ABO/RH(D): O POS

## 2019-04-19 LAB — PREPARE RBC (CROSSMATCH)

## 2019-04-19 LAB — MAGNESIUM: Magnesium: 1.5 mg/dL — ABNORMAL LOW (ref 1.7–2.4)

## 2019-04-19 MED ORDER — SODIUM CHLORIDE 0.9% IV SOLUTION: Freq: Once | INTRAVENOUS | Status: AC

## 2019-04-19 MED ORDER — SODIUM CHLORIDE 0.9% IV SOLUTION
Freq: Once | INTRAVENOUS | Status: AC
  Administered 2019-04-19: 16:00:00 via INTRAVENOUS

## 2019-04-19 NOTE — Progress Notes (Signed)
Hemoglobin OF 6.8, MD kyle paged and notified.

## 2019-04-19 NOTE — Progress Notes (Addendum)
Charles Calhoun  PROGRESS NOTE    Charles Calhoun  GNF:[id number] DOB: 06-14-1970 DOA: 04/06/2019 PCP: System, Pcp Not In   Brief Narrative:   48 year old male with history of alcohol abuse also potential alcoholic pancreatitis who was admitted with possible infected pancreatic pseudocyst is transferred from another facility where he presented with nausea weakness and fever and abdominal pain  11/11: Denies complaints ON. Eager to leave.   Assessment & Plan:   Principal Problem:   MRSA bacteremia Active Problems:   Sepsis (HCC)   Pancreatitis   Pancreatic pseudocyst   Cirrhosis (HCC)   Diabetes mellitus (HCC)   Alcohol abuse  Sepsis with MRSA bacteremia     - on IV vancomycin (see below), and PO augmentin (through 11/19)       - Cultures are negative.  He has been afebrile overall begin to de-escalate the antibiotics per pharmacy     - PER QI:ONGE bacteremia - on vancomycin and repeat blood cultures with clearance. Needs TEE to rule out endocarditis but not getting it done here. Still awaiting transfer to [place]. Will need 6 weeks of IV vancomycin if no TEE done through 05/19/19, if TEE done and negative for vegetation, can stop 11/13. PICC line is still in place.      - TEE ordered; follow  Pancreatic pseudocyst     - continue augmentin through 11/19 per ID  Ileus and gastric outlet obstruction likely due to enlarging part of pancreatic pseudocyst,     - NG tube was removed      - TPN is stopped and he is on low residue oral diet; tolerating, monior  Hypokalemia     - labs pending  History of alcohol abuse      - last drink was 2 months ago     - thiamine, MVI  Hypertension Sinus tach     - metoprolol; tele  Pancreatic pseudocyst     - GI recommended to advance diet to low residue low-fat diet if he continues to tolerate liquids and also to avoid opiates.     - diet advanced; TPN d/c'd  Severe protein calorie malnutrition with hypoalbuminemia.     - encourage diet   Normocytic anemia     - stable, monitor  TEE ordered. Follow. Possible that he can stop IV vanc dependent TEE results.   DVT prophylaxis: lovenox Code Status: FULL Family Communication: None at bedside   Disposition Plan: TBD  Consultants:   GI  Infectious disease  Antimicrobials:  . Vanc, augmentin   ROS:  Denies CP, dyspnea, N, V . Remainder 10-pt ROS is negative for all not previously mentioned.  Subjective: "When do I get to go home?"  Objective: Vitals:   04/18/19 1227 04/18/19 1938 04/19/19 0528 04/19/19 0815  BP: (!) 150/109 (!) 145/100 (!) 140/97 (!) 145/100  Pulse: (!) 114 (!) 117 (!) 105 (!) 111  Resp: 18 20 16    Temp: 99.2 F (37.3 C) 98.3 F (36.8 C) 98.3 F (36.8 C)   TempSrc: Oral Oral Oral   SpO2: 97% 97% 100%   Weight:   94.7 kg   Height:        Intake/Output Summary (Last 24 hours) at 04/19/2019 1020 Last data filed at 04/19/2019 0530 Gross per 24 hour  Intake 954.84 ml  Output 1800 ml  Net -845.16 ml   Filed Weights   04/17/19 0036 04/18/19 0144 04/19/19 0528  Weight: 93.7 kg 95.7 kg 94.7 kg    Examination:  General: 48  y.o. male resting in bed in NAD Cardiovascular: RRR, +S1, S2, no m/g/r, equal pulses throughout Respiratory: CTABL, no w/r/r, normal WOB GI: BS+, NT, distended but soft, no masses noted, no organomegaly noted MSK: No e/c/c Skin: No rashes, bruises, ulcerations noted Neuro: alert to name, follows commands Psyc: Appropriate interaction and affect, calm/cooperative   Data Reviewed: I have personally reviewed following labs and imaging studies.  CBC: Recent Labs  Lab 04/17/19 0613 04/17/19 0843 04/18/19 0315  WBC 7.8 7.9 8.1  NEUTROABS 5.6 5.8  --   HGB 7.0* 7.0* 7.0*  HCT 24.1* 24.1* 23.3*  MCV 82.0 82.8 81.2  PLT 457* 426* 384   Basic Metabolic Panel: Recent Labs  Lab 04/13/19 0608 04/14/19 0801 04/15/19 0413 04/16/19 0607 04/17/19 0613  NA 141 139 139 139 139  K 3.3* 4.9 2.8* 3.1* 3.4*  CL 112*  108 110 107 108  CO2 22 23 24 26 27   GLUCOSE 119* 571* 124* 115* 128*  BUN <5* <5* <5* 6 7  CREATININE 0.72 0.75 0.79 0.60* 0.70  CALCIUM 7.5* 7.7* 7.4* 7.4* 7.6*  MG 1.5* 2.7* 1.5* 1.6* 1.7  PHOS 3.2 4.5 3.0 2.7 2.5   GFR: Estimated Creatinine Clearance: 131.9 mL/min (by C-G formula based on SCr of 0.7 mg/dL). Liver Function Tests: Recent Labs  Lab 04/13/19 0608 04/17/19 0613  AST 22 30  ALT 20 11  ALKPHOS 78 70  BILITOT 0.7 0.5  PROT 6.2* 6.4*  ALBUMIN 1.4* 1.3*   No results for input(s): LIPASE, AMYLASE in the last 168 hours. No results for input(s): AMMONIA in the last 168 hours. Coagulation Profile: No results for input(s): INR, PROTIME in the last 168 hours. Cardiac Enzymes: No results for input(s): CKTOTAL, CKMB, CKMBINDEX, TROPONINI in the last 168 hours. BNP (last 3 results) No results for input(s): PROBNP in the last 8760 hours. HbA1C: No results for input(s): HGBA1C in the last 72 hours. CBG: Recent Labs  Lab 04/18/19 1119 04/18/19 1641 04/18/19 2146 04/18/19 2332 04/19/19 0538  GLUCAP 116* 104* 102* 198* 87   Lipid Profile: Recent Labs    04/17/19 0613  TRIG 55   Thyroid Function Tests: No results for input(s): TSH, T4TOTAL, FREET4, T3FREE, THYROIDAB in the last 72 hours. Anemia Panel: No results for input(s): VITAMINB12, FOLATE, FERRITIN, TIBC, IRON, RETICCTPCT in the last 72 hours. Sepsis Labs: No results for input(s): PROCALCITON, LATICACIDVEN in the last 168 hours.  Recent Results (from the past 240 hour(s))  SARS CORONAVIRUS 2 (TAT 6-24 HRS) Nasopharyngeal Nasopharyngeal Swab     Status: None   Collection Time: 04/13/19 12:43 PM   Specimen: Nasopharyngeal Swab  Result Value Ref Range Status   SARS Coronavirus 2 NEGATIVE NEGATIVE Final    Comment: (NOTE) SARS-CoV-2 target nucleic acids are NOT DETECTED. The SARS-CoV-2 RNA is generally detectable in upper and lower respiratory specimens during the acute phase of infection. Negative  results do not preclude SARS-CoV-2 infection, do not rule out co-infections with other pathogens, and should not be used as the sole basis for treatment or other patient management decisions. Negative results must be combined with clinical observations, patient history, and epidemiological information. The expected result is Negative. Fact Sheet for Patients: HairSlick.nohttps://www.fda.gov/media/138098/download Fact Sheet for Healthcare Providers: quierodirigir.comhttps://www.fda.gov/media/138095/download This test is not yet approved or cleared by the [place] FDA and  has been authorized for detection and/or diagnosis of SARS-CoV-2 by FDA under an Emergency Use Authorization (EUA). This EUA will remain  in effect (meaning this test can be  used) for the duration of the COVID-19 declaration under Section 56 4(b)(1) of the Act, 21 U.S.C. section 360bbb-3(b)(1), unless the authorization is terminated or revoked sooner. Performed at [facility], [place] [address] [place], [place] [address]       Radiology Studies: No results found.   Scheduled Meds: . amoxicillin-clavulanate  1 tablet Oral Q12H  . Chlorhexidine Gluconate Cloth  6 each Topical Daily  . Chlorhexidine Gluconate Cloth  6 each Topical Daily  . enoxaparin (LOVENOX) injection  40 mg Subcutaneous Q24H  . feeding supplement (ENSURE ENLIVE)  237 mL Oral BID BM  . folic acid  1 mg Oral Daily  . insulin aspart  0-9 Units Subcutaneous Q6H  . metoprolol succinate  25 mg Oral Daily  . multivitamin with minerals  1 tablet Oral Daily  . mupirocin ointment   Nasal BID  . pantoprazole (PROTONIX) IV  40 mg Intravenous Q12H  . sodium chloride flush  10-40 mL Intracatheter Q12H  . thiamine  100 mg Oral Daily   Continuous Infusions: . sodium chloride 20 mL/hr at 04/17/19 1230  . vancomycin 1,000 mg (04/19/19 0544)     LOS: 13 days    Time spent: 25 minutes spent in the coordination of care today.    Jonnie Finner, DO [facility]  Pager [phone]  If 7PM-7AM, please contact night-coverage [email or web address] Password [email or web address] 04/19/2019, 10:20 AM

## 2019-04-20 LAB — GLUCOSE, CAPILLARY
Glucose-Capillary: 102 mg/dL — ABNORMAL HIGH (ref 70–99)
Glucose-Capillary: 119 mg/dL — ABNORMAL HIGH (ref 70–99)
Glucose-Capillary: 82 mg/dL (ref 70–99)
Glucose-Capillary: 82 mg/dL (ref 70–99)

## 2019-04-20 LAB — CBC WITH DIFFERENTIAL/PLATELET
Abs Immature Granulocytes: 0.02 10*3/uL (ref 0.00–0.07)
Basophils Absolute: 0 10*3/uL (ref 0.0–0.1)
Basophils Relative: 1 %
Eosinophils Absolute: 0.1 10*3/uL (ref 0.0–0.5)
Eosinophils Relative: 2 %
HCT: 27.1 % — ABNORMAL LOW (ref 39.0–52.0)
Hemoglobin: 8.1 g/dL — ABNORMAL LOW (ref 13.0–17.0)
Immature Granulocytes: 0 %
Lymphocytes Relative: 18 %
Lymphs Abs: 1.4 10*3/uL (ref 0.7–4.0)
MCH: 24.4 pg — ABNORMAL LOW (ref 26.0–34.0)
MCHC: 29.9 g/dL — ABNORMAL LOW (ref 30.0–36.0)
MCV: 81.6 fL (ref 80.0–100.0)
Monocytes Absolute: 0.8 10*3/uL (ref 0.1–1.0)
Monocytes Relative: 11 %
Neutro Abs: 5 10*3/uL (ref 1.7–7.7)
Neutrophils Relative %: 68 %
Platelets: 368 10*3/uL (ref 150–400)
RBC: 3.32 MIL/uL — ABNORMAL LOW (ref 4.22–5.81)
RDW: 19.9 % — ABNORMAL HIGH (ref 11.5–15.5)
WBC: 7.4 10*3/uL (ref 4.0–10.5)
nRBC: 0 % (ref 0.0–0.2)

## 2019-04-20 LAB — COMPREHENSIVE METABOLIC PANEL
ALT: 17 U/L (ref 0–44)
AST: 56 U/L — ABNORMAL HIGH (ref 15–41)
Albumin: 1.7 g/dL — ABNORMAL LOW (ref 3.5–5.0)
Alkaline Phosphatase: 121 U/L (ref 38–126)
Anion gap: 5 (ref 5–15)
BUN: 6 mg/dL (ref 6–20)
CO2: 24 mmol/L (ref 22–32)
Calcium: 8 mg/dL — ABNORMAL LOW (ref 8.9–10.3)
Chloride: 110 mmol/L (ref 98–111)
Creatinine, Ser: 0.77 mg/dL (ref 0.61–1.24)
GFR calc Af Amer: >60 mL/min (ref >60)
GFR calc non Af Amer: >60 mL/min (ref >60)
Glucose, Bld: 90 mg/dL (ref 70–99)
Potassium: 3.9 mmol/L (ref 3.5–5.1)
Sodium: 139 mmol/L (ref 135–145)
Total Bilirubin: 0.3 mg/dL (ref 0.3–1.2)
Total Protein: 7.4 g/dL (ref 6.5–8.1)

## 2019-04-20 LAB — TYPE AND SCREEN
ABO/RH(D): O POS
Antibody Screen: NEGATIVE
Unit division: 0

## 2019-04-20 LAB — PHOSPHORUS: Phosphorus: 3.5 mg/dL (ref 2.5–4.6)

## 2019-04-20 LAB — BPAM RBC
Blood Product Expiration Date: 202012162359
ISSUE DATE / TIME: 202011111835
Unit Type and Rh: 5100

## 2019-04-20 LAB — MAGNESIUM: Magnesium: 1.4 mg/dL — ABNORMAL LOW (ref 1.7–2.4)

## 2019-04-20 MED ORDER — METOPROLOL SUCCINATE ER 50 MG PO TB24
50.0000 mg | ORAL_TABLET | Freq: Every day | ORAL | Status: DC
  Administered 2019-04-21 – 2019-04-24 (×4): 50 mg via ORAL
  Filled 2019-04-20 (×4): qty 1

## 2019-04-20 MED ORDER — MAGNESIUM SULFATE 2 GM/50ML IV SOLN
2.0000 g | Freq: Once | INTRAVENOUS | Status: AC
  Administered 2019-04-20: 2 g via INTRAVENOUS
  Filled 2019-04-20: qty 50

## 2019-04-20 MED ORDER — SODIUM CHLORIDE 0.9 % IV SOLN
INTRAVENOUS | Status: DC
  Administered 2019-04-20: 20:00:00 via INTRAVENOUS

## 2019-04-20 NOTE — Progress Notes (Signed)
[name].  PROGRESS NOTE    Charles Calhoun  ZOX:[id number] DOB: 02-21-1971 DOA: 04/06/2019 PCP: System, Pcp Not In   Brief Narrative:   48 year old male with history of alcohol abuse also potential alcoholic pancreatitis who was admitted with possible infected pancreatic pseudocyst is transferred from another facility where he presented with nausea weakness and fever and abdominal pain  11/12: Doing ok ON. Denies complaints. On the schedule for TEE tomorrow.    Assessment & Plan:   Principal Problem:   MRSA bacteremia Active Problems:   Sepsis (HCC)   Pancreatitis   Pancreatic pseudocyst   Cirrhosis (HCC)   Diabetes mellitus (HCC)   Alcohol abuse  Sepsis with MRSA bacteremia     - on IV vancomycin (see below), and PO augmentin (through 11/19)       - Cultures are negative.  He has been afebrile overall begin to de-escalate the antibiotics per pharmacy     - PER WJ:XBJY:MRSA bacteremia - on vancomycin and repeat blood cultures with clearance. Needs TEE to rule out endocarditis but not getting it done here. Still awaiting transfer to [facility]. Will need 6 weeks of IV vancomycin if no TEE done through 05/19/19, if TEE done and negative for vegetation, can stop 11/13. PICC line is still in place.      - TEE ordered; spoke with cardiology, on the schedule for tomorrow.   Pancreatic pseudocyst     - continue augmentin through 11/19 per ID     - GI rec cystogastrostomy w/ [facility] in [place]  Ileus and gastric outlet obstruction likely due to enlarging part of pancreatic pseudocyst,     - NG tube was removed      - TPN is stopped and he is on low residue oral diet; tolerating, monitor  Hypokalemia Hypomagnesemia     - K+ resolved     - Mg2+ low; add Mg2+ IV  History of alcohol abuse      - last drink was 2 months ago     - thiamine, MVI  Hypertension Sinus tach     - metoprolol; tele     - increase metoprolol to 50; monitor  Pancreatic pseudocyst     - GI recommended to advance diet to  low residue low-fat diet if he continues to tolerate liquids and also to avoid opiates.     - diet advanced; TPN d/c'd  Severe protein calorie malnutrition with hypoalbuminemia.     - encourage diet  Normocytic anemia     - Hgb was 6.8 yesterday; 1 unit pRBCs given     - Hgb 8.1 this AM; good response  Needs TEE. If negative, can d/c vanc tomorrow. If positive, will need IV vanc through 12/11 and will need follow up in [facility] w/ [facility].   DVT prophylaxis: lovenox Code Status: FULL   Disposition Plan: TBD  Consultants:   GI  ID  Cardiology  Antimicrobials:  . Vanc, augmentin   ROS:  Denies CP, dyspnea, ab pain, N, V. Reports diarrhea. Remainder 10-pt ROS is negative for all not previously mentioned.  Subjective: "So they are going to look at my heart again?"  Objective: Vitals:   04/19/19 1950 04/19/19 2226 04/20/19 0324 04/20/19 0339  BP: (!) 133/105 (!) 145/109  (!) 136/103  Pulse: (!) 113 (!) 115  (!) 110  Resp: 20 18  16   Temp: 98.6 F (37 C) 98.1 F (36.7 C)  98.1 F (36.7 C)  TempSrc: Oral Oral  Oral  SpO2: 100%  100%  97%  Weight:   94.6 kg   Height:        Intake/Output Summary (Last 24 hours) at 04/20/2019 9678 Last data filed at 04/20/2019 0400 Gross per 24 hour  Intake 915 ml  Output 2900 ml  Net -1985 ml   Filed Weights   04/18/19 0144 04/19/19 0528 04/20/19 0324  Weight: 95.7 kg 94.7 kg 94.6 kg    Examination:  General: 48 y.o. male resting in bed in NAD Cardiovascular: RRR, +S1, S2, no m/g/r Respiratory: CTABL, no w/r/r, normal WOB GI: BS+, distended, but soft, NT MSK: No e/c/c Neuro: alert to name, follows commands Psyc: Appropriate interaction and affect, calm/cooperative   Data Reviewed: I have personally reviewed following labs and imaging studies.  CBC: Recent Labs  Lab 04/17/19 0613 04/17/19 0843 04/18/19 0315 04/19/19 1145 04/20/19 0320  WBC 7.8 7.9 8.1 6.4 7.4  NEUTROABS 5.6 5.8  --  4.4 5.0  HGB 7.0* 7.0* 7.0*  6.8* 8.1*  HCT 24.1* 24.1* 23.3* 23.1* 27.1*  MCV 82.0 82.8 81.2 79.9* 81.6  PLT 457* 426* 384 375 368   Basic Metabolic Panel: Recent Labs  Lab 04/15/19 0413 04/16/19 0607 04/17/19 0613 04/19/19 1145 04/20/19 0320  NA 139 139 139 138 139  K 2.8* 3.1* 3.4* 3.7 3.9  CL 110 107 108 108 110  CO2 24 26 27 25 24   GLUCOSE 124* 115* 128* 97 90  BUN <5* 6 7 6 6   CREATININE 0.79 0.60* 0.70 0.67 0.77  CALCIUM 7.4* 7.4* 7.6* 7.7* 8.0*  MG 1.5* 1.6* 1.7 1.5* 1.4*  PHOS 3.0 2.7 2.5 3.4 3.5   GFR: Estimated Creatinine Clearance: 131.9 mL/min (by C-G formula based on SCr of 0.77 mg/dL). Liver Function Tests: Recent Labs  Lab 04/17/19 0613 04/19/19 1145 04/20/19 0320  AST 30  --  56*  ALT 11  --  17  ALKPHOS 70  --  121  BILITOT 0.5  --  0.3  PROT 6.4*  --  7.4  ALBUMIN 1.3* 1.4* 1.7*   No results for input(s): LIPASE, AMYLASE in the last 168 hours. No results for input(s): AMMONIA in the last 168 hours. Coagulation Profile: No results for input(s): INR, PROTIME in the last 168 hours. Cardiac Enzymes: No results for input(s): CKTOTAL, CKMB, CKMBINDEX, TROPONINI in the last 168 hours. BNP (last 3 results) No results for input(s): PROBNP in the last 8760 hours. HbA1C: No results for input(s): HGBA1C in the last 72 hours. CBG: Recent Labs  Lab 04/19/19 0538 04/19/19 1226 04/19/19 1717 04/19/19 2354 04/20/19 0517  GLUCAP 87 91 98 82 119*   Lipid Profile: No results for input(s): CHOL, HDL, LDLCALC, TRIG, CHOLHDL, LDLDIRECT in the last 72 hours. Thyroid Function Tests: No results for input(s): TSH, T4TOTAL, FREET4, T3FREE, THYROIDAB in the last 72 hours. Anemia Panel: No results for input(s): VITAMINB12, FOLATE, FERRITIN, TIBC, IRON, RETICCTPCT in the last 72 hours. Sepsis Labs: No results for input(s): PROCALCITON, LATICACIDVEN in the last 168 hours.  Recent Results (from the past 240 hour(s))  SARS CORONAVIRUS 2 (TAT 6-24 HRS) Nasopharyngeal Nasopharyngeal Swab      Status: None   Collection Time: 04/13/19 12:43 PM   Specimen: Nasopharyngeal Swab  Result Value Ref Range Status   SARS Coronavirus 2 NEGATIVE NEGATIVE Final    Comment: (NOTE) SARS-CoV-2 target nucleic acids are NOT DETECTED. The SARS-CoV-2 RNA is generally detectable in upper and lower respiratory specimens during the acute phase of infection. Negative results do not preclude SARS-CoV-2  infection, do not rule out co-infections with other pathogens, and should not be used as the sole basis for treatment or other patient management decisions. Negative results must be combined with clinical observations, patient history, and epidemiological information. The expected result is Negative. Fact Sheet for Patients: SugarRoll.be Fact Sheet for Healthcare Providers: https://www.woods-mathews.com/ This test is not yet approved or cleared by the [place] FDA and  has been authorized for detection and/or diagnosis of SARS-CoV-2 by FDA under an Emergency Use Authorization (EUA). This EUA will remain  in effect (meaning this test can be used) for the duration of the COVID-19 declaration under Section 56 4(b)(1) of the Act, 21 U.S.C. section 360bbb-3(b)(1), unless the authorization is terminated or revoked sooner. Performed at [facility], [place] [address], [place], [place] [address]       Radiology Studies: No results found.   Scheduled Meds: . amoxicillin-clavulanate  1 tablet Oral Q12H  . Chlorhexidine Gluconate Cloth  6 each Topical Daily  . Chlorhexidine Gluconate Cloth  6 each Topical Daily  . enoxaparin (LOVENOX) injection  40 mg Subcutaneous Q24H  . feeding supplement (ENSURE ENLIVE)  237 mL Oral BID BM  . folic acid  1 mg Oral Daily  . insulin aspart  0-9 Units Subcutaneous Q6H  . metoprolol succinate  25 mg Oral Daily  . multivitamin with minerals  1 tablet Oral Daily  . mupirocin ointment   Nasal BID  . pantoprazole  (PROTONIX) IV  40 mg Intravenous Q12H  . sodium chloride flush  10-40 mL Intracatheter Q12H  . thiamine  100 mg Oral Daily   Continuous Infusions: . sodium chloride 20 mL/hr at 04/17/19 1230  . vancomycin 1,000 mg (04/20/19 0519)     LOS: 14 days    Time spent: 25 minutes spent in the coordination of care today.    Jonnie Finner, DO [facility] Pager [phone]  If 7PM-7AM, please contact night-coverage [email or web address] Password [facility] 04/20/2019, 7:14 AM

## 2019-04-20 NOTE — Progress Notes (Signed)
    [facility] has been requested to perform a transesophageal echocardiogram on Mr. Tappan for Bacteremia.  After careful review of history and examination, the risks and benefits of transesophageal echocardiogram have been explained including risks of esophageal damage, perforation (1:10,000 risk), bleeding, pharyngeal hematoma as well as other potential complications associated with conscious sedation including aspiration, arrhythmia, respiratory failure and death. Alternatives to treatment were discussed, questions were answered. Patient is willing to proceed.  TEE - Dr. Johnsie Cancel @ 1400. NPO after midnight. Meds with sips.   Leanor Kail, PA-C 04/20/2019 2:47 PM

## 2019-04-20 NOTE — Progress Notes (Signed)
Nutrition Follow-up  DOCUMENTATION CODES:   Not applicable  INTERVENTION:   -Continue Ensure Enlive po BID, each supplement provides 350 kcal and 20 grams of protein -Continue Magic cup TID with meals, each supplement provides 290 kcal and 9 grams of protein -Continue MVI with minerals daily  NUTRITION DIAGNOSIS:   Increased nutrient needs related to acute illness as evidenced by estimated needs.  Ongoing  GOAL:   Patient will meet greater than or equal to 90% of their needs  Progressing  MONITOR:   PO intake, Supplement acceptance, Labs, Weight trends, Skin, I & O's  REASON FOR ASSESSMENT:   Consult New TPN/TNA  ASSESSMENT:   48 yo male admitted from [facility] with sepsis, enlarging pancreatic pseudocysts. PMH includes alcoholic pancreatitis, DM, pancreatic pseudocyst (receiving home TPN since 03/04/2019).  10/31- PICC pulled due to bacteremia 11/2- NGT d/c 11/3- transferred from ICU to PCU 11/4- PICC placed, TPN re-started 11/5- NGT clamped 11/6- NGT removed, advanced to clear liquid diet 11/8- advanced to soft diet, TPN d/c  Reviewed I/O's: -2 L x 24 hours and +11.7 L since admission  UOP: 2.9 L since admission  Spoke with pt at bedside, who was pleasant and in good spirits today. He reports good appetite and has been consuming almost all of his food that is provided to him. Noted meal completion 50-100%. Pt is enjoying Ensure and Magic Cups. Educated pt on importance of good meal and supplement intake to promote healing, especially given increased nutritional needs. Pt is happy to have been weaned off TPN, but understands importance of oral nutritional supplements to help meet his nutritional needs.   Per cardiology service, plan for TEE tomorrow. Plan to continue IV antibiotics. Pt is awaiting transfer back to [facility] in [place].  Labs reviewed: Mg: 1.4, CBGS: 82-119.   NUTRITION - FOCUSED PHYSICAL EXAM:    Most Recent Value  Orbital Region  No  depletion  Upper Arm Region  No depletion  Thoracic and Lumbar Region  No depletion  Buccal Region  No depletion  Temple Region  Mild depletion  Clavicle Bone Region  No depletion  Clavicle and Acromion Bone Region  No depletion  Scapular Bone Region  No depletion  Dorsal Hand  No depletion  Patellar Region  No depletion  Anterior Thigh Region  No depletion  Posterior Calf Region  No depletion  Edema (RD Assessment)  Mild  Hair  Reviewed  Eyes  Reviewed  Mouth  Reviewed  Skin  Reviewed  Nails  Reviewed       Diet Order:   Diet Order            DIET SOFT Room service appropriate? Yes; Fluid consistency: Thin  Diet effective now              EDUCATION NEEDS:   Not appropriate for education at this time  Skin:  Skin Assessment: Reviewed RN Assessment  Last BM:  04/19/19  Height:   Ht Readings from Last 1 Encounters:  04/10/19 6' (1.829 m)    Weight:   Wt Readings from Last 1 Encounters:  04/20/19 94.6 kg    Ideal Body Weight:  80.9 kg  BMI:  Body mass index is 28.29 kg/m.  Estimated Nutritional Needs:   Kcal:  2500-2700  Protein:  125-135 gm  Fluid:  >/= 2.4 L    Gorman Safi A. Jimmye Norman, RD, LDN, [place] Registered Dietitian II Certified Diabetes Care and Education Specialist Pager: [phone] After hours Pager: [phone]

## 2019-04-20 NOTE — TOC Progression Note (Signed)
Transition of Care [facility]) - Progression Note    Patient Details  Name: Charles Calhoun MRN: [id number] Date of Birth: 02-Jan-1971  Transition of Care [facility] CM/SW Contact  Zenon Mayo, RN Phone Number: 04/20/2019, 2:08 PM  Clinical Narrative:    Patient for TEE tomorrow, if vegetation will be on Vanc for 6 weeks and will need HHRN for iv abx. If no vegetation will not need vanc.  Carolynn Sayers is following in case needs HH iv abx.     Barriers to Discharge: Other (comment)(awaiting bed for acute to acute transfer)  Expected Discharge Plan and Services                                                 Social Determinants of Health (SDOH) Interventions    Readmission Risk Interventions No flowsheet data found.

## 2019-04-21 ENCOUNTER — Inpatient Hospital Stay (HOSPITAL_COMMUNITY): Payer: Non-veteran care | Admitting: Certified Registered Nurse Anesthetist

## 2019-04-21 ENCOUNTER — Encounter (HOSPITAL_COMMUNITY)
Admission: AD | Disposition: A | Discharge status: Home Health | Payer: Self-pay | Source: Other Acute Inpatient Hospital | Attending: Pulmonary Disease

## 2019-04-21 ENCOUNTER — Inpatient Hospital Stay (HOSPITAL_COMMUNITY): Payer: Non-veteran care

## 2019-04-21 DIAGNOSIS — I339 Acute and subacute endocarditis, unspecified: Secondary | ICD-10-CM

## 2019-04-21 DIAGNOSIS — I361 Nonrheumatic tricuspid (valve) insufficiency: Secondary | ICD-10-CM

## 2019-04-21 DIAGNOSIS — R7881 Bacteremia: Secondary | ICD-10-CM

## 2019-04-21 DIAGNOSIS — I342 Nonrheumatic mitral (valve) stenosis: Secondary | ICD-10-CM

## 2019-04-21 HISTORY — PX: TEE WITHOUT CARDIOVERSION: SHX5443

## 2019-04-21 LAB — RENAL FUNCTION PANEL
Albumin: 1.7 g/dL — ABNORMAL LOW (ref 3.5–5.0)
Anion gap: 9 (ref 5–15)
BUN: 5 mg/dL — ABNORMAL LOW (ref 6–20)
CO2: 24 mmol/L (ref 22–32)
Calcium: 7.9 mg/dL — ABNORMAL LOW (ref 8.9–10.3)
Chloride: 104 mmol/L (ref 98–111)
Creatinine, Ser: 0.74 mg/dL (ref 0.61–1.24)
GFR calc Af Amer: >60 mL/min (ref >60)
GFR calc non Af Amer: >60 mL/min (ref >60)
Glucose, Bld: 93 mg/dL (ref 70–99)
Phosphorus: 3.6 mg/dL (ref 2.5–4.6)
Potassium: 3.9 mmol/L (ref 3.5–5.1)
Sodium: 137 mmol/L (ref 135–145)

## 2019-04-21 LAB — CBC WITH DIFFERENTIAL/PLATELET
Abs Immature Granulocytes: 0.03 10*3/uL (ref 0.00–0.07)
Basophils Absolute: 0 10*3/uL (ref 0.0–0.1)
Basophils Relative: 1 %
Eosinophils Absolute: 0.1 10*3/uL (ref 0.0–0.5)
Eosinophils Relative: 2 %
HCT: 26 % — ABNORMAL LOW (ref 39.0–52.0)
Hemoglobin: 7.7 g/dL — ABNORMAL LOW (ref 13.0–17.0)
Immature Granulocytes: 1 %
Lymphocytes Relative: 20 %
Lymphs Abs: 1.2 10*3/uL (ref 0.7–4.0)
MCH: 24 pg — ABNORMAL LOW (ref 26.0–34.0)
MCHC: 29.6 g/dL — ABNORMAL LOW (ref 30.0–36.0)
MCV: 81 fL (ref 80.0–100.0)
Monocytes Absolute: 0.8 10*3/uL (ref 0.1–1.0)
Monocytes Relative: 12 %
Neutro Abs: 4 10*3/uL (ref 1.7–7.7)
Neutrophils Relative %: 64 %
Platelets: 325 10*3/uL (ref 150–400)
RBC: 3.21 MIL/uL — ABNORMAL LOW (ref 4.22–5.81)
RDW: 19.7 % — ABNORMAL HIGH (ref 11.5–15.5)
WBC: 6.1 10*3/uL (ref 4.0–10.5)
nRBC: 0 % (ref 0.0–0.2)

## 2019-04-21 LAB — MAGNESIUM: Magnesium: 1.5 mg/dL — ABNORMAL LOW (ref 1.7–2.4)

## 2019-04-21 LAB — GLUCOSE, CAPILLARY
Glucose-Capillary: 71 mg/dL (ref 70–99)
Glucose-Capillary: 86 mg/dL (ref 70–99)
Glucose-Capillary: 91 mg/dL (ref 70–99)
Glucose-Capillary: 99 mg/dL (ref 70–99)

## 2019-04-21 LAB — VANCOMYCIN, PEAK: Vancomycin Pk: 31 ug/mL (ref 30–40)

## 2019-04-21 LAB — VANCOMYCIN, TROUGH: Vancomycin Tr: 15 ug/mL (ref 15–20)

## 2019-04-21 SURGERY — ECHOCARDIOGRAM, TRANSESOPHAGEAL
Anesthesia: Monitor Anesthesia Care

## 2019-04-21 MED ORDER — LIDOCAINE 2% (20 MG/ML) 5 ML SYRINGE
INTRAMUSCULAR | Status: DC | PRN
  Administered 2019-04-21: 80 mg via INTRAVENOUS

## 2019-04-21 MED ORDER — PROPOFOL 500 MG/50ML IV EMUL
INTRAVENOUS | Status: DC | PRN
  Administered 2019-04-21: 125 ug/kg/min via INTRAVENOUS

## 2019-04-21 MED ORDER — PROPOFOL 10 MG/ML IV BOLUS
INTRAVENOUS | Status: DC | PRN
  Administered 2019-04-21 (×4): 10 mg via INTRAVENOUS
  Administered 2019-04-21: 30 mg via INTRAVENOUS
  Administered 2019-04-21: 10 mg via INTRAVENOUS

## 2019-04-21 MED ORDER — MAGNESIUM SULFATE 2 GM/50ML IV SOLN
2.0000 g | Freq: Once | INTRAVENOUS | Status: AC
  Administered 2019-04-21: 2 g via INTRAVENOUS
  Filled 2019-04-21: qty 50

## 2019-04-21 NOTE — Consult Note (Signed)
[address]       [place] [address]             [phone]      Cardiothoracic Surgery Consultation  Reason for Consult: Tricuspid valve endocarditis Referring Physician: Dr. Margie Ege, DO  Charles Calhoun is an 48 y.o. male.  HPI:   The patient is a 48 year old gentleman with history of alcohol abuse and pancreatitis who had a PICC line inserted in Sept and was on TNA for pancreatic pseudocyts.  He presented to [facility] in [place] with worsening shortness of breath, anorexia, nausea, abdominal pain and fever and had a CT showing increasing size of the pseudocysts with signs of sepsis and was transferred her for further care. He had positive BC for MRSA, WBC 22K, LA of 6.0, PCT of 15.85. He has responded to antibiotics with normalization of WBC ct, no fever and follow up BC negative. He had a TEE showing a 1.3 cm mobile vegetation on the tricuspid valve with mild TR.  He says that he quit drinking ETOH. He denies any hx of IVDA. Smoked marijuana occasionally.  Past Medical History:  Diagnosis Date  . Cirrhosis (HCC) 04/07/2019  . Hypertension   . Pancreatitis 04/07/2019    Past Surgical History:  Procedure Laterality Date  . ESOPHAGOGASTRODUODENOSCOPY      Family History  Problem Relation Age of Onset  . Liver cancer Neg Hx     Social History:  reports that he has quit smoking. He has never used smokeless tobacco. He reports current alcohol use. He reports that he does not use drugs.  Allergies: No Known Allergies  Medications:  I have reviewed the patient's current medications. Prior to Admission:  Medications Prior to Admission  Medication Sig Dispense Refill Last Dose  . acetaminophen (TYLENOL) 325 MG tablet Take 650 mg by mouth every 6 (six) hours as needed for mild pain, fever or headache.   UNK  . folic acid (FOLVITE) 400 MCG tablet Take 400 mcg by mouth daily.   UNK  . metoprolol succinate (TOPROL-XL) 25 MG 24 hr tablet Take 25 mg by  mouth daily.   UNK at [facility]  . ondansetron (ZOFRAN) 4 MG tablet Take 8 mg by mouth every 8 (eight) hours as needed for nausea/vomiting.   UNK  . PRESCRIPTION MEDICATION Inject 2,250 mLs into the vein every 12 (twelve) hours. amino acid 10% (AMINOSYN II) 10 % SolP, D70W SolP with sodium chloride 23.4% 4 mEq/mL SolP, fat emulsion 20 % Emul 300 mL   UNK   Scheduled: . amoxicillin-clavulanate  1 tablet Oral Q12H  . Chlorhexidine Gluconate Cloth  6 each Topical Daily  . Chlorhexidine Gluconate Cloth  6 each Topical Daily  . enoxaparin (LOVENOX) injection  40 mg Subcutaneous Q24H  . feeding supplement (ENSURE ENLIVE)  237 mL Oral BID BM  . folic acid  1 mg Oral Daily  . insulin aspart  0-9 Units Subcutaneous Q6H  . metoprolol succinate  50 mg Oral Daily  . multivitamin with minerals  1 tablet Oral Daily  . mupirocin ointment   Nasal BID  . pantoprazole (PROTONIX) IV  40 mg Intravenous Q12H  . sodium chloride flush  10-40 mL Intracatheter Q12H  . thiamine  100 mg Oral Daily   Continuous: . sodium chloride 20 mL/hr at 04/21/19 1105  . vancomycin 1,000 mg (04/21/19 0514)   VOZ:DGUYQI chloride, acetaminophen, HYDROmorphone (DILAUDID) injection, ondansetron (ZOFRAN) IV, sodium chloride flush  Results for orders  placed or performed during the hospital encounter of 04/06/19 (from the past 48 hour(s))  Glucose, capillary     Status: None   Collection Time: 04/19/19  5:17 PM  Result Value Ref Range   Glucose-Capillary 98 70 - 99 mg/dL   Comment 1 Notify RN    Comment 2 Document in Chart   Prepare RBC     Status: None   Collection Time: 04/19/19  6:18 PM  Result Value Ref Range   Order Confirmation      ORDER PROCESSED BY BLOOD BANK Performed at [facility], [address], [place], [place] [address]   Glucose, capillary     Status: None   Collection Time: 04/19/19 11:54 PM  Result Value Ref Range   Glucose-Capillary 82 70 - 99 mg/dL  Comprehensive metabolic panel     Status: Abnormal    Collection Time: 04/20/19  3:20 AM  Result Value Ref Range   Sodium 139 135 - 145 mmol/L   Potassium 3.9 3.5 - 5.1 mmol/L   Chloride 110 98 - 111 mmol/L   CO2 24 22 - 32 mmol/L   Glucose, Bld 90 70 - 99 mg/dL   BUN 6 6 - 20 mg/dL   Creatinine, Ser 0.77 0.61 - 1.24 mg/dL   Calcium 8.0 (L) 8.9 - 10.3 mg/dL   Total Protein 7.4 6.5 - 8.1 g/dL   Albumin 1.7 (L) 3.5 - 5.0 g/dL   AST 56 (H) 15 - 41 U/L   ALT 17 0 - 44 U/L   Alkaline Phosphatase 121 38 - 126 U/L   Total Bilirubin 0.3 0.3 - 1.2 mg/dL   GFR calc non Af Amer >60 >60 mL/min   GFR calc Af Amer >60 >60 mL/min   Anion gap 5 5 - 15    Comment: Performed at [facility], [place] [address], [place], [place] [address]  Magnesium     Status: Abnormal   Collection Time: 04/20/19  3:20 AM  Result Value Ref Range   Magnesium 1.4 (L) 1.7 - 2.4 mg/dL    Comment: Performed at [facility] [address], [place], [place] [address]  Phosphorus     Status: None   Collection Time: 04/20/19  3:20 AM  Result Value Ref Range   Phosphorus 3.5 2.5 - 4.6 mg/dL    Comment: Performed at [facility] [address] [place], [place] [address]  CBC with Differential/Platelet     Status: Abnormal   Collection Time: 04/20/19  3:20 AM  Result Value Ref Range   WBC 7.4 4.0 - 10.5 K/uL   RBC 3.32 (L) 4.22 - 5.81 MIL/uL   Hemoglobin 8.1 (L) 13.0 - 17.0 g/dL   HCT 27.1 (L) 39.0 - 52.0 %   MCV 81.6 80.0 - 100.0 fL   MCH 24.4 (L) 26.0 - 34.0 pg   MCHC 29.9 (L) 30.0 - 36.0 g/dL   RDW 19.9 (H) 11.5 - 15.5 %   Platelets 368 150 - 400 K/uL   nRBC 0.0 0.0 - 0.2 %   Neutrophils Relative % 68 %   Neutro Abs 5.0 1.7 - 7.7 K/uL   Lymphocytes Relative 18 %   Lymphs Abs 1.4 0.7 - 4.0 K/uL   Monocytes Relative 11 %   Monocytes Absolute 0.8 0.1 - 1.0 K/uL   Eosinophils Relative 2 %   Eosinophils Absolute 0.1 0.0 - 0.5 K/uL   Basophils Relative 1 %   Basophils Absolute 0.0  0.0 - 0.1 K/uL   Immature Granulocytes 0 %   Abs Immature  Granulocytes 0.02 0.00 - 0.07 K/uL    Comment: Performed at [facility], [address] [place], [place] [address]  Glucose, capillary     Status: Abnormal   Collection Time: 04/20/19  5:17 AM  Result Value Ref Range   Glucose-Capillary 119 (H) 70 - 99 mg/dL  Glucose, capillary     Status: None   Collection Time: 04/20/19 12:02 PM  Result Value Ref Range   Glucose-Capillary 82 70 - 99 mg/dL  Glucose, capillary     Status: Abnormal   Collection Time: 04/20/19  4:37 PM  Result Value Ref Range   Glucose-Capillary 102 (H) 70 - 99 mg/dL  Glucose, capillary     Status: None   Collection Time: 04/21/19 12:03 AM  Result Value Ref Range   Glucose-Capillary 99 70 - 99 mg/dL   Comment 1 Notify RN    Comment 2 Document in Chart   Renal function panel     Status: Abnormal   Collection Time: 04/21/19  4:23 AM  Result Value Ref Range   Sodium 137 135 - 145 mmol/L   Potassium 3.9 3.5 - 5.1 mmol/L   Chloride 104 98 - 111 mmol/L   CO2 24 22 - 32 mmol/L   Glucose, Bld 93 70 - 99 mg/dL   BUN 5 (L) 6 - 20 mg/dL   Creatinine, Ser 1.91 0.61 - 1.24 mg/dL   Calcium 7.9 (L) 8.9 - 10.3 mg/dL   Phosphorus 3.6 2.5 - 4.6 mg/dL   Albumin 1.7 (L) 3.5 - 5.0 g/dL   GFR calc non Af Amer >60 >60 mL/min   GFR calc Af Amer >60 >60 mL/min   Anion gap 9 5 - 15    Comment: Performed at [facility], [address], [place], [place] [address]  Magnesium     Status: Abnormal   Collection Time: 04/21/19  4:23 AM  Result Value Ref Range   Magnesium 1.5 (L) 1.7 - 2.4 mg/dL    Comment: Performed at [facility], [address], [place], [place] [address]  CBC with Differential/Platelet     Status: Abnormal   Collection Time: 04/21/19  4:24 AM  Result Value Ref Range   WBC 6.1 4.0 - 10.5 K/uL   RBC 3.21 (L) 4.22 - 5.81 MIL/uL   Hemoglobin 7.7 (L) 13.0 - 17.0 g/dL   HCT 08.6 (L) 57.8 - 46.9 %   MCV 81.0 80.0 - 100.0 fL   MCH 24.0 (L) 26.0 - 34.0 pg   MCHC 29.6 (L) 30.0 - 36.0 g/dL   RDW 62.9  (H) 52.8 - 15.5 %   Platelets 325 150 - 400 K/uL   nRBC 0.0 0.0 - 0.2 %   Neutrophils Relative % 64 %   Neutro Abs 4.0 1.7 - 7.7 K/uL   Lymphocytes Relative 20 %   Lymphs Abs 1.2 0.7 - 4.0 K/uL   Monocytes Relative 12 %   Monocytes Absolute 0.8 0.1 - 1.0 K/uL   Eosinophils Relative 2 %   Eosinophils Absolute 0.1 0.0 - 0.5 K/uL   Basophils Relative 1 %   Basophils Absolute 0.0 0.0 - 0.1 K/uL   Immature Granulocytes 1 %   Abs Immature Granulocytes 0.03 0.00 - 0.07 K/uL    Comment: Performed at [facility], [address], [place], [place] [address]  Glucose, capillary     Status: None  Collection Time: 04/21/19  6:01 AM  Result Value Ref Range   Glucose-Capillary 86 70 - 99 mg/dL   Comment 1 Notify RN    Comment 2 Document in Chart   Glucose, capillary     Status: None   Collection Time: 04/21/19 11:27 AM  Result Value Ref Range   Glucose-Capillary 71 70 - 99 mg/dL    No results found.  Review of Systems  Constitutional: Positive for fever and malaise/fatigue.  Respiratory: Positive for shortness of breath. Negative for cough and hemoptysis.   Cardiovascular: Negative for chest pain.  Gastrointestinal: Positive for abdominal pain, nausea and vomiting.  Neurological: Negative for dizziness, seizures, loss of consciousness and headaches.   Blood pressure (!) 145/112, pulse (!) 103, temperature 98.1 F (36.7 C), temperature source Oral, resp. rate 19, height 6' (1.829 m), weight 93.5 kg, SpO2 99 %. Physical Exam  Constitutional: He appears well-developed.  Eyes: Pupils are equal, round, and reactive to light. Conjunctivae and EOM are normal.  Neck: Normal range of motion. Neck supple. No JVD present.  Cardiovascular: Normal rate, regular rhythm and normal heart sounds.  No murmur heard. Respiratory: Effort normal and breath sounds normal.  GI: Soft. Bowel sounds are normal. He exhibits distension.  Lymphadenopathy:    He has no cervical adenopathy.   Procedure:  Transesophageal Echo  Indications:     Bacteremia 790.7   History:         Patient has prior history of Echocardiogram examinations, most                  recent 04/07/2019.   Sonographer:     Tonia GhentJulia Underwood RDCS Referring Phys:  [id number] Myrtie NeitherNWANNADIYA UGAH Diagnosing Phys: Charlton HawsPeter Nishan MD     PROCEDURE: Consent was requested emergently by emergency room physicain. Patients was monitored while under deep sedation. The transesophogeal probe was passed through the esophogus of the patient. The patient's vital signs; including heart rate, blood  pressure, and oxygen saturation; remained stable throughout the procedure. The patient developed no complications during the procedure.  IMPRESSIONS    1. Left ventricular ejection fraction, by visual estimation, is 60 to 65%. The left ventricle has normal function. Normal left ventricular size. There is no left ventricular hypertrophy.  2. Global right ventricle has normal systolic function.The right ventricular size is normal. No increase in right ventricular wall thickness.  3. Left atrial size was normal.  4. Right atrial size was normal.  5. The mitral valve is normal in structure. No evidence of mitral valve regurgitation. Mild mitral stenosis.  6. Moderately sized vegetation on the tricuspid valve.  7. The tricuspid valve is abnormal. Tricuspid valve regurgitation is mild.  8. Mobile vegeation on lateral leaflet measures 1.3 cm in longest dimension.  9. The aortic valve is normal in structure. Aortic valve regurgitation is not visualized. No evidence of aortic valve sclerosis or stenosis. 10. The pulmonic valve was normal in structure. Pulmonic valve regurgitation is not visualized. 11. The inferior vena cava is normal in size with greater than 50% respiratory variability, suggesting right atrial pressure of 3 mmHg.  FINDINGS  Left Ventricle: Left ventricular ejection fraction, by visual estimation, is 60 to 65%. The left ventricle has  normal function. There is no left ventricular hypertrophy. Normal left ventricular size. Normal left atrial pressure.  Right Ventricle: The right ventricular size is normal. No increase in right ventricular wall thickness. Global RV systolic function is has normal systolic function.  Left Atrium: Left atrial size  was normal in size.  Right Atrium: Right atrial size was normal in size  Pericardium: There is no evidence of pericardial effusion.  Mitral Valve: The mitral valve is normal in structure. There is mild thickening of the mitral valve leaflet(s). Mild mitral valve stenosis by observation. No evidence of mitral valve regurgitation.  Tricuspid Valve: The tricuspid valve is abnormal. Tricuspid valve regurgitation is mild. There is a moderately sized mobile tricuspid valve vegetation on the anterior leaflet. Mobile vegeation on lateral leaflet measures 1.3 cm in longest dimension.  Aortic Valve: The aortic valve is normal in structure. Aortic valve regurgitation is not visualized. The aortic valve is structurally normal, with no evidence of sclerosis or stenosis.  Pulmonic Valve: The pulmonic valve was normal in structure. Pulmonic valve regurgitation is not visualized.  Aorta: The aortic root, ascending aorta and aortic arch are all structurally normal, with no evidence of dilitation or obstruction.  Venous: The inferior vena cava is normal in size with greater than 50% respiratory variability, suggesting right atrial pressure of 3 mmHg.  Shunts: No ventricular septal defect is seen or detected. There is no evidence of an atrial septal defect. No atrial level shunt detected by color flow Doppler.    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 Etiology may be his prior PICC line  which has been changed. His sepsis has resolved and follow up [facility] were negative. There is no indication for surgical treatment of his tricuspid endocarditis and would treat with a full 6 wk course of antibiotics with a follow up echo afterward. I don't think his pseudocysts are infected because he would not have gotten better with antibiotics alone.  Payton Doughty Negin Hegg 04/21/2019, 3:08 PM

## 2019-04-21 NOTE — Transfer of Care (Signed)
Immediate Anesthesia Transfer of Care Note  Patient: Charles Calhoun  Procedure(s) Performed: TRANSESOPHAGEAL ECHOCARDIOGRAM (TEE) (N/A )  Patient Location: Endoscopy Unit  Anesthesia Type:MAC  Level of Consciousness: drowsy  Airway & Oxygen Therapy: Patient Spontanous Breathing and Patient connected to nasal cannula oxygen  Post-op Assessment: Report given to RN and Post -op Vital signs reviewed and stable  Post vital signs: Reviewed  Last Vitals:  Vitals Value Taken Time  BP 126/102 04/21/19 1025  Temp    Pulse 107 04/21/19 1025  Resp 19 04/21/19 1025  SpO2 100 % 04/21/19 1025    Last Pain:  Vitals:   04/21/19 1025  TempSrc:   PainSc: 0-No pain      Patients Stated Pain Goal: 0 (16/10/96 0454)  Complications: No apparent anesthesia complications

## 2019-04-21 NOTE — Progress Notes (Signed)
  Echocardiogram Echocardiogram Transesophageal has been performed.  Charles Calhoun 04/21/2019, 10:25 AM

## 2019-04-21 NOTE — Progress Notes (Signed)
Pharmacy Antibiotic Note  Charles Calhoun is a 48 y.o. male with MRSA bacteremia. Pharmacy is consulted for vancomycin.    TEE this am shows vegetation on tricuspid valve. Will order vancomycin levels this evening around next dose.   Patient currently afebrile with wbc of 6. Renal function stable.   Plan: Continue Vancomycin 1 g IV q12h Est AUC 430 Recheck levels tonight with positive TEE  Height: 6' (182.9 cm) Weight: 206 lb 2.1 oz (93.5 kg) IBW/kg (Calculated) : 77.6  Temp (24hrs), Avg:98.7 F (37.1 C), Min:98.4 F (36.9 C), Max:98.9 F (37.2 C)  Recent Labs  Lab 04/16/19 0607  04/17/19 0613 04/17/19 0843 04/17/19 1620 04/17/19 2216 04/18/19 0315 04/19/19 1145 04/20/19 0320 04/21/19 0423 04/21/19 0424  WBC  --    < > 7.8 7.9  --   --  8.1 6.4 7.4  --  6.1  CREATININE 0.60*  --  0.70  --   --   --   --  0.67 0.77 0.74  --   VANCOTROUGH  --   --   --   --   --  19  --   --   --   --   --   VANCOPEAK  --   --   --   --  34  --   --   --   --   --   --    < > = values in this interval not displayed.    Estimated Creatinine Clearance: 131.3 mL/min (by C-G formula based on SCr of 0.74 mg/dL).    No Known Allergies  Antimicrobials this admission: Meropenem 10/29 >> 10/30 Zosyn 10/29 at [place] 10/29 >>  CTX 10/30>>11/9 Flagyl 10/30>>11/9 Augmentin 11/10>>  Microbiology results: 10/31: BC - ngF OSH: 3/4 BC positive for GPC  10/30 Ucx: neg 10/30 Bcx: MRSA 10/29 MRSA PCR positive   Erin Hearing PharmD., BCPS Clinical Pharmacist 04/21/2019 11:06 AM

## 2019-04-21 NOTE — Anesthesia Preprocedure Evaluation (Addendum)
Anesthesia Evaluation  Patient identified by MRN, date of birth, ID band Patient awake    Reviewed: Allergy & Precautions, NPO status , Patient's Chart, lab work & pertinent test results, reviewed documented beta blocker date and time   History of Anesthesia Complications Negative for: history of anesthetic complications  Airway Mallampati: II  TM Distance: >3 FB Neck ROM: Full    Dental  (+) Poor Dentition,    Pulmonary former smoker,    Pulmonary exam normal        Cardiovascular hypertension, Pt. on home beta blockers Normal cardiovascular exam  TTE 04/07/19: EF 60 to 65%     Neuro/Psych negative neurological ROS  negative psych ROS   GI/Hepatic negative GI ROS, (+) Cirrhosis     substance abuse  alcohol use,   Endo/Other  negative endocrine ROS  Renal/GU negative Renal ROS  negative genitourinary   Musculoskeletal negative musculoskeletal ROS (+)   Abdominal   Peds  Hematology negative hematology ROS (+)   Anesthesia Other Findings Bacteremia  Reproductive/Obstetrics negative OB ROS                            Anesthesia Physical Anesthesia Plan  ASA: III  Anesthesia Plan: MAC   Post-op Pain Management:    Induction:   PONV Risk Score and Plan: Treatment may vary due to age or medical condition and Propofol infusion  Airway Management Planned: Natural Airway and Nasal Cannula  Additional Equipment: None  Intra-op Plan:   Post-operative Plan:   Informed Consent: I have reviewed the patients History and Physical, chart, labs and discussed the procedure including the risks, benefits and alternatives for the proposed anesthesia with the patient or authorized representative who has indicated his/her understanding and acceptance.     Dental advisory given  Plan Discussed with: CRNA  Anesthesia Plan Comments:         Anesthesia Quick Evaluation

## 2019-04-21 NOTE — Plan of Care (Signed)
  Problem: Education: Goal: Knowledge of General Education information will improve Description Including pain rating scale, medication(s)/side effects and non-pharmacologic comfort measures Outcome: Progressing   

## 2019-04-21 NOTE — Anesthesia Procedure Notes (Signed)
Procedure Name: MAC Date/Time: 04/21/2019 10:04 AM Performed by: Janene Harvey, CRNA Pre-anesthesia Checklist: Patient identified, Emergency Drugs available, Suction available and Patient being monitored Patient Re-evaluated:Patient Re-evaluated prior to induction Oxygen Delivery Method: Nasal cannula Dental Injury: Teeth and Oropharynx as per pre-operative assessment

## 2019-04-21 NOTE — TOC Progression Note (Addendum)
Transition of Care [facility]) - Progression Note    Patient Details  Name: Amery Minasyan MRN: [id number] Date of Birth: January 13, 1971  Transition of [facility]) CM/SW Contact  Zenon Mayo, RN Phone Number: 04/21/2019, 3:35 PM  Clinical Narrative:    Patient is already set up with [facility] [phone] for [facility], they will need orders to resume, their fax number is [phone].  NCM spoke with Pam with [facility] they will supply the medication.  Per MD they are not planning to do surgery so patient can be discharged.  Patient is calling his girlfriend to inform her. Awaiting HHRN order to fax to [facility]. [facility] can not see patient until Monday.  MD aware, patient will dc on Monday with iv abx.  Carolynn Sayers also following.     Barriers to Discharge: Other (comment)(awaiting bed for acute to acute transfer)  Expected Discharge Plan and Services                                                 Social Determinants of Health (SDOH) Interventions    Readmission Risk Interventions No flowsheet data found.

## 2019-04-21 NOTE — Progress Notes (Signed)
PHARMACY CONSULT NOTE FOR:  OUTPATIENT  PARENTERAL ANTIBIOTIC THERAPY (OPAT)  Indication: MRSA bacteremia/TV endocarditis Regimen: Vancomycin 1000 mg every 12 hours  End date: 05/19/2019  IV antibiotic discharge orders are pended. To discharging provider:  please sign these orders via discharge navigator,  Select New Orders & click on the button choice - Manage This Unsigned Work.     Thank you for allowing pharmacy to be a part of this patient's care.  Jimmy Footman, PharmD, BCPS, [place] [facility] Clinical Pharmacist Phone: [phone] 04/21/2019, 1:46 PM\

## 2019-04-21 NOTE — Progress Notes (Signed)
Pharmacy Antibiotic Note  Charles Calhoun is a 48 y.o. male with MRSA bacteremia. Pharmacy is consulted for vancomycin.    TEE this am shows vegetation on tricuspid valve  WBC 6.1, afebrile; Scr 0.74, CrCl 131 ml/min, renal function stable  Vancomycin levels drawn around the 1800 dose this evening (dose of vancomycin 1 gm IV Q 12 hr regimen given at 1815): Vancomycin trough (drawn prior to 1815 dose): 15 mg/L at 1730  Vancomycin peak: 31 mg/L at 2035 Calculated vancomycin AUC on this regimen, using levels above, is 551.1, which is just slightly above the goal vancomycin AUC of 400-550  Plan: Continue vancomycin 1 g IV Q 12 hrs Monitor renal function, WBC, temp, cultures  Height: 6' (182.9 cm) Weight: 206 lb 2.1 oz (93.5 kg) IBW/kg (Calculated) : 77.6  Temp (24hrs), Avg:98.6 F (37 C), Min:98.1 F (36.7 C), Max:98.9 F (37.2 C)  Recent Labs  Lab 04/16/19 0607  04/17/19 0613 04/17/19 0843 04/17/19 1620 04/17/19 2216 04/18/19 0315 04/19/19 1145 04/20/19 0320 04/21/19 0423 04/21/19 0424 04/21/19 1730 04/21/19 2035  WBC  --    < > 7.8 7.9  --   --  8.1 6.4 7.4  --  6.1  --   --   CREATININE 0.60*  --  0.70  --   --   --   --  0.67 0.77 0.74  --   --   --   VANCOTROUGH  --   --   --   --   --  19  --   --   --   --   --  15  --   VANCOPEAK  --   --   --   --  34  --   --   --   --   --   --   --  31   < > = values in this interval not displayed.    Estimated Creatinine Clearance: 131.3 mL/min (by C-G formula based on SCr of 0.74 mg/dL).    No Known Allergies  Antimicrobials this admission: Meropenem 10/29 >> 10/30 Zosyn 10/29 at [place] 10/29 >>  CTX 10/30>>11/9 Flagyl 10/30>>11/9 Augmentin 11/10>>  Microbiology results: 10/31: BC - NG/final OSH: 3/4 BC positive for GPC  10/30 Ucx: NG/final 10/30 Bcx: MRSA 10/29 MRSA PCR positive   Gillermina Hu, PharmD, BCPS, [place] [facility] Clinical Pharmacist 04/21/2019 9:35 PM

## 2019-04-21 NOTE — Anesthesia Postprocedure Evaluation (Signed)
Anesthesia Post Note  Patient: Charles Calhoun  Procedure(s) Performed: TRANSESOPHAGEAL ECHOCARDIOGRAM (TEE) (N/A )     Patient location during evaluation: PACU Anesthesia Type: MAC Level of consciousness: awake and alert and oriented Pain management: pain level controlled Vital Signs Assessment: post-procedure vital signs reviewed and stable Respiratory status: spontaneous breathing, nonlabored ventilation and respiratory function stable Cardiovascular status: blood pressure returned to baseline Postop Assessment: no apparent nausea or vomiting Anesthetic complications: no    Last Vitals:  Vitals:   04/21/19 1034 04/21/19 1053  BP: (!) 143/107 (!) 147/104  Pulse: (!) 103 (!) 102  Resp: 19   Temp:    SpO2: 99%     Last Pain:  Vitals:   04/21/19 1100  TempSrc:   PainSc: [place]

## 2019-04-21 NOTE — CV Procedure (Signed)
TEE: Anesthesia: Propofol  1.3 cm vegetation on lateral leaflet of TV Mobile only mild resultant TR Normal PV Normal MV mild MR Normal tri leaflet AV EF 55% Normal RV size and function  No PFO No effusion No LAA thrombus Normal aorta no significant debris  3D imaging of the TV performed   [employer]

## 2019-04-21 NOTE — Progress Notes (Signed)
[name]  PROGRESS NOTE    Nicolaas Savo  DSK:[id number] DOB: 1971/02/24 DOA: 04/06/2019 PCP: System, Pcp Not In   Brief Narrative:   48 year old male with history of alcohol abuse also potential alcoholic pancreatitis who was admitted with possible infected pancreatic pseudocyst is transferred from another facility where he presented with nausea weakness and fever and abdominal pain  11/12: Doing ok ON. Denies complaints. On the schedule for TEE tomorrow.  11/13: TEE w/ 1.3cm TV vegetation. Spoke with ID. Abx through 12/11. Consulted CTS.    Assessment & Plan:   Principal Problem:   MRSA bacteremia Active Problems:   Sepsis ([place])   Pancreatitis   Pancreatic pseudocyst   Cirrhosis ([place])   Diabetes mellitus ([place])   Alcohol abuse  Sepsis with MRSA bacteremia Endocarditis - on IV vancomycin (see below), and PO augmentin (through 11/19)  - Cultures are negative. He has been afebrile overall begin to de-escalate the antibiotics per pharmacy - PER IO:MBTD bacteremia - on vancomycin and repeat blood cultures with clearance. Needs TEE to rule out endocarditis but not getting it done here. Still awaiting transfer to [place]. Will need 6 weeks of IV vancomycin  through 05/19/19 as TEE is positive for vegetation on TV     - consulting CTS  Pancreatic pseudocyst - continue augmentin through 11/19 per ID     - GI rec cystogastrostomy w/ [facility] in [place]  Ileus and gastric outlet obstruction likely due to enlarging part of pancreatic pseudocyst, - NG tube was removed  - TPN is stopped and he is on low residue oral diet; tolerating, monitor  Hypokalemia Hypomagnesemia - K+ resolved     - Mg2+ low; add Mg2+ IV  History of alcohol abuse  - last drink was 2 months ago - thiamine, MVI  Hypertension Sinus tach - metoprolol; tele     - increase metoprolol to 50; monitor  Pancreatic pseudocyst - GI recommended to advance diet to low residue  low-fat diet if he continues to tolerate liquids and also to avoid opiates. - diet advanced; TPN d/c'd  Severe protein calorie malnutrition with hypoalbuminemia. - encourage diet  Normocytic anemia - Hgb was 6.8 yesterday; 1 unit pRBCs given, good response     - stable, monitor  Vegetation found on TV. CTS consulted. Continue   DVT prophylaxis: lovenox Code Status: FULL   Disposition Plan: TBD  Consultants:   GI  ID  Cardiology  Antimicrobials:  . Vanc, augmentin   ROS:  Denies CP, dyspnea, HA, dizziness, palpitations . Remainder 10-pt ROS is negative for all not previously mentioned.  Subjective: "Yeah, they told me."  Objective: Vitals:   04/21/19 1025 04/21/19 1034 04/21/19 1053 04/21/19 1129  BP: (!) 126/102 (!) 143/107 (!) 147/104 (!) 145/112  Pulse: (!) 107 (!) 103 (!) 102 (!) 103  Resp: 19 19  19   Temp: 98.9 F (37.2 C)   98.1 F (36.7 C)  TempSrc: Temporal   Oral  SpO2: 100% 99%  99%  Weight:      Height:        Intake/Output Summary (Last 24 hours) at 04/21/2019 1340 Last data filed at 04/21/2019 1100 Gross per 24 hour  Intake 1060 ml  Output 1950 ml  Net -890 ml   Filed Weights   04/20/19 0324 04/21/19 0412 04/21/19 0920  Weight: 94.6 kg 93.5 kg 93.5 kg    Examination:  General: 48 y.o. male resting in bed in NAD Cardiovascular: RRR, +S1, S2, no m/g/r, equal pulses throughout Respiratory:  CTABL, no w/r/r GI: BS+, distended, soft, non-tender, no masses noted, MSK: No e/c/c Neuro: A&O x 3, no focal deficits Psyc:  calm/cooperative   Data Reviewed: I have personally reviewed following labs and imaging studies.  CBC: Recent Labs  Lab 04/17/19 0613 04/17/19 0843 04/18/19 0315 04/19/19 1145 04/20/19 0320 04/21/19 0424  WBC 7.8 7.9 8.1 6.4 7.4 6.1  NEUTROABS 5.6 5.8  --  4.4 5.0 4.0  HGB 7.0* 7.0* 7.0* 6.8* 8.1* 7.7*  HCT 24.1* 24.1* 23.3* 23.1* 27.1* 26.0*  MCV 82.0 82.8 81.2 79.9* 81.6 81.0  PLT 457* 426* 384  375 368 325   Basic Metabolic Panel: Recent Labs  Lab 04/16/19 0607 04/17/19 0613 04/19/19 1145 04/20/19 0320 04/21/19 0423  NA 139 139 138 139 137  K 3.1* 3.4* 3.7 3.9 3.9  CL 107 108 108 110 104  CO2 26 27 25 24 24   GLUCOSE 115* 128* 97 90 93  BUN 6 7 6 6  5*  CREATININE 0.60* 0.70 0.67 0.77 0.74  CALCIUM 7.4* 7.6* 7.7* 8.0* 7.9*  MG 1.6* 1.7 1.5* 1.4* 1.5*  PHOS 2.7 2.5 3.4 3.5 3.6   GFR: Estimated Creatinine Clearance: 131.3 mL/min (by C-G formula based on SCr of 0.74 mg/dL). Liver Function Tests: Recent Labs  Lab 04/17/19 0613 04/19/19 1145 04/20/19 0320 04/21/19 0423  AST 30  --  56*  --   ALT 11  --  17  --   ALKPHOS 70  --  121  --   BILITOT 0.5  --  0.3  --   PROT 6.4*  --  7.4  --   ALBUMIN 1.3* 1.4* 1.7* 1.7*   No results for input(s): LIPASE, AMYLASE in the last 168 hours. No results for input(s): AMMONIA in the last 168 hours. Coagulation Profile: No results for input(s): INR, PROTIME in the last 168 hours. Cardiac Enzymes: No results for input(s): CKTOTAL, CKMB, CKMBINDEX, TROPONINI in the last 168 hours. BNP (last 3 results) No results for input(s): PROBNP in the last 8760 hours. HbA1C: No results for input(s): HGBA1C in the last 72 hours. CBG: Recent Labs  Lab 04/20/19 1202 04/20/19 1637 04/21/19 0003 04/21/19 0601 04/21/19 1127  GLUCAP 82 102* 99 86 71   Lipid Profile: No results for input(s): CHOL, HDL, LDLCALC, TRIG, CHOLHDL, LDLDIRECT in the last 72 hours. Thyroid Function Tests: No results for input(s): TSH, T4TOTAL, FREET4, T3FREE, THYROIDAB in the last 72 hours. Anemia Panel: No results for input(s): VITAMINB12, FOLATE, FERRITIN, TIBC, IRON, RETICCTPCT in the last 72 hours. Sepsis Labs: No results for input(s): PROCALCITON, LATICACIDVEN in the last 168 hours.  Recent Results (from the past 240 hour(s))  SARS CORONAVIRUS 2 (TAT 6-24 HRS) Nasopharyngeal Nasopharyngeal Swab     Status: None   Collection Time: 04/13/19 12:43 PM    Specimen: Nasopharyngeal Swab  Result Value Ref Range Status   SARS Coronavirus 2 NEGATIVE NEGATIVE Final    Comment: (NOTE) SARS-CoV-2 target nucleic acids are NOT DETECTED. The SARS-CoV-2 RNA is generally detectable in upper and lower respiratory specimens during the acute phase of infection. Negative results do not preclude SARS-CoV-2 infection, do not rule out co-infections with other pathogens, and should not be used as the sole basis for treatment or other patient management decisions. Negative results must be combined with clinical observations, patient history, and epidemiological information. The expected result is Negative. Fact Sheet for Patients: 04/23/19 Fact Sheet for Healthcare Providers: 13/05/20 This test is not yet approved or cleared by the [employer]  FDA and  has been authorized for detection and/or diagnosis of SARS-CoV-2 by FDA under an Emergency Use Authorization (EUA). This EUA will remain  in effect (meaning this test can be used) for the duration of the COVID-19 declaration under Section 56 4(b)(1) of the Act, 21 U.S.C. section 360bbb-3(b)(1), unless the authorization is terminated or revoked sooner. Performed at [facility], [address] [place], [place] [address]       Radiology Studies: No results found.   Scheduled Meds: . amoxicillin-clavulanate  1 tablet Oral Q12H  . Chlorhexidine Gluconate Cloth  6 each Topical Daily  . Chlorhexidine Gluconate Cloth  6 each Topical Daily  . enoxaparin (LOVENOX) injection  40 mg Subcutaneous Q24H  . feeding supplement (ENSURE ENLIVE)  237 mL Oral BID BM  . folic acid  1 mg Oral Daily  . insulin aspart  0-9 Units Subcutaneous Q6H  . metoprolol succinate  50 mg Oral Daily  . multivitamin with minerals  1 tablet Oral Daily  . mupirocin ointment   Nasal BID  . pantoprazole (PROTONIX) IV  40 mg Intravenous Q12H  . sodium chloride  flush  10-40 mL Intracatheter Q12H  . thiamine  100 mg Oral Daily   Continuous Infusions: . sodium chloride 20 mL/hr at 04/21/19 1105  . vancomycin 1,000 mg (04/21/19 0514)     LOS: 15 days    Time spent: 35 minutes spent in the coordination of care today.    Teddy Spikeyrone A Makinna Andy, DO [facility] Pager [phone]  If 7PM-7AM, please contact night-coverage [email or web address] Password [email or web address] 04/21/2019, 1:40 PM

## 2019-04-21 NOTE — Progress Notes (Signed)
    [facility] for Infectious [facility]   Reason for visit: Follow up on bacteremia  Interval History: some pain in left quadrant of abdomen, eating better.  No fever and WBC wnl.  No new complaints otherwise.  No associated rash or diarrhea. Repeat blood cultures have remained negative.  Day 16 total antibiotics Day 15 vancomycin   Physical Exam: Constitutional:  Vitals:   04/21/19 1129 04/21/19 1552  BP: (!) 145/112 (!) 141/97  Pulse: (!) 103 (!) 109  Resp: 19 18  Temp: 98.1 F (36.7 C) 98.4 F (36.9 C)  SpO2: 99% 100%   patient appears in NAD Eyes: anicteric Respiratory: Normal respiratory effort; CTA B Cardiovascular: RRR GI: soft, some mild tenderness in left quadrant, soft  Review of Systems: Constitutional: negative for fevers and chills Gastrointestinal: negative for nausea and diarrhea   Lab Results  Component Value Date   WBC 6.1 04/21/2019   HGB 7.7 (L) 04/21/2019   HCT 26.0 (L) 04/21/2019   MCV 81.0 04/21/2019   PLT 325 04/21/2019    Lab Results  Component Value Date   CREATININE 0.74 04/21/2019   BUN 5 (L) 04/21/2019   NA 137 04/21/2019   K 3.9 04/21/2019   CL 104 04/21/2019   CO2 24 04/21/2019    Lab Results  Component Value Date   ALT 17 04/20/2019   AST 56 (H) 04/20/2019   ALKPHOS 121 04/20/2019     Microbiology: Recent Results (from the past 240 hour(s))  SARS CORONAVIRUS 2 (TAT 6-24 HRS) Nasopharyngeal Nasopharyngeal Swab     Status: None   Collection Time: 04/13/19 12:43 PM   Specimen: Nasopharyngeal Swab  Result Value Ref Range Status   SARS Coronavirus 2 NEGATIVE NEGATIVE Final    Comment: (NOTE) SARS-CoV-2 target nucleic acids are NOT DETECTED. The SARS-CoV-2 RNA is generally detectable in upper and lower respiratory specimens during the acute phase of infection. Negative results do not preclude SARS-CoV-2 infection, do not rule out co-infections with other pathogens, and should not be used as the sole basis for treatment  or other patient management decisions. Negative results must be combined with clinical observations, patient history, and epidemiological information. The expected result is Negative. Fact Sheet for Patients: [employer] Fact Sheet for Healthcare Providers: https://www.woods-mathews.com/ This test is not yet approved or cleared by the [place] FDA and  has been authorized for detection and/or diagnosis of SARS-CoV-2 by FDA under an Emergency Use Authorization (EUA). This EUA will remain  in effect (meaning this test can be used) for the duration of the COVID-19 declaration under Section 56 4(b)(1) of the Act, 21 U.S.C. section 360bbb-3(b)(1), unless the authorization is terminated or revoked sooner. Performed at [facility], [place] [address] [place], [place] [address]     Impression/Plan:  1. MRSA bacteremia - on vancomycin and now with TEE evidence of TV endocarditis.  He will need treatment through 12/11.    2.  Access - has a picc line.   3.  Pancreatic pseudocyst - on Augmentin and stable.  Can continue for 5 days.    I am available for follow up if needed, but he will be going back to [place] for care.   I will sign off, call with any questions.

## 2019-04-22 DIAGNOSIS — I10 Essential (primary) hypertension: Secondary | ICD-10-CM

## 2019-04-22 LAB — RENAL FUNCTION PANEL
Albumin: 1.6 g/dL — ABNORMAL LOW (ref 3.5–5.0)
Anion gap: 6 (ref 5–15)
BUN: 5 mg/dL — ABNORMAL LOW (ref 6–20)
CO2: 24 mmol/L (ref 22–32)
Calcium: 8 mg/dL — ABNORMAL LOW (ref 8.9–10.3)
Chloride: 106 mmol/L (ref 98–111)
Creatinine, Ser: 0.78 mg/dL (ref 0.61–1.24)
GFR calc Af Amer: >60 mL/min (ref >60)
GFR calc non Af Amer: >60 mL/min (ref >60)
Glucose, Bld: 97 mg/dL (ref 70–99)
Phosphorus: 3.8 mg/dL (ref 2.5–4.6)
Potassium: 4 mmol/L (ref 3.5–5.1)
Sodium: 136 mmol/L (ref 135–145)

## 2019-04-22 LAB — CBC WITH DIFFERENTIAL/PLATELET
Abs Immature Granulocytes: 0.01 10*3/uL (ref 0.00–0.07)
Basophils Absolute: 0 10*3/uL (ref 0.0–0.1)
Basophils Relative: 1 %
Eosinophils Absolute: 0.1 10*3/uL (ref 0.0–0.5)
Eosinophils Relative: 2 %
HCT: 25.3 % — ABNORMAL LOW (ref 39.0–52.0)
Hemoglobin: 7.7 g/dL — ABNORMAL LOW (ref 13.0–17.0)
Immature Granulocytes: 0 %
Lymphocytes Relative: 22 %
Lymphs Abs: 1.3 10*3/uL (ref 0.7–4.0)
MCH: 24 pg — ABNORMAL LOW (ref 26.0–34.0)
MCHC: 30.4 g/dL (ref 30.0–36.0)
MCV: 78.8 fL — ABNORMAL LOW (ref 80.0–100.0)
Monocytes Absolute: 0.8 10*3/uL (ref 0.1–1.0)
Monocytes Relative: 13 %
Neutro Abs: 3.6 10*3/uL (ref 1.7–7.7)
Neutrophils Relative %: 62 %
Platelets: 316 10*3/uL (ref 150–400)
RBC: 3.21 MIL/uL — ABNORMAL LOW (ref 4.22–5.81)
RDW: 19.7 % — ABNORMAL HIGH (ref 11.5–15.5)
WBC: 5.7 10*3/uL (ref 4.0–10.5)
nRBC: 0 % (ref 0.0–0.2)

## 2019-04-22 LAB — GLUCOSE, CAPILLARY
Glucose-Capillary: 87 mg/dL (ref 70–99)
Glucose-Capillary: 83 mg/dL (ref 70–99)
Glucose-Capillary: 76 mg/dL (ref 70–99)
Glucose-Capillary: 92 mg/dL (ref 70–99)

## 2019-04-22 LAB — MAGNESIUM: Magnesium: 1.6 mg/dL — ABNORMAL LOW (ref 1.7–2.4)

## 2019-04-22 MED ORDER — VANCOMYCIN HCL IN DEXTROSE 1-5 GM/200ML-% IV SOLN
1000.0000 mg | Freq: Two times a day (BID) | INTRAVENOUS | Status: DC
  Administered 2019-04-22 – 2019-04-24 (×5): 1000 mg via INTRAVENOUS
  Filled 2019-04-22 (×5): qty 200

## 2019-04-22 MED ORDER — AMLODIPINE BESYLATE 5 MG PO TABS
5.0000 mg | ORAL_TABLET | Freq: Every day | ORAL | Status: DC
  Administered 2019-04-22 – 2019-04-23 (×2): 5 mg via ORAL
  Filled 2019-04-22 (×2): qty 1

## 2019-04-22 MED ORDER — MAGNESIUM OXIDE 400 (241.3 MG) MG PO TABS
400.0000 mg | ORAL_TABLET | Freq: Two times a day (BID) | ORAL | Status: DC
  Administered 2019-04-22 – 2019-04-24 (×5): 400 mg via ORAL
  Filled 2019-04-22 (×5): qty 1

## 2019-04-22 NOTE — Progress Notes (Signed)
[name]  PROGRESS NOTE    Charles Calhoun  QQV:[id number] DOB: 10/06/1970 DOA: 04/06/2019 PCP: System, Pcp Not In   Brief Narrative:   48 year old male with history of alcohol abuse also potential alcoholic pancreatitis who was admitted with possible infected pancreatic pseudocyst is transferred from another facility where he presented with nausea weakness and fever and abdominal pain  11/12: Doing ok ON. Denies complaints. On the schedule for TEE tomorrow. 11/13: TEE w/ 1.3cm TV vegetation. Spoke with ID. Abx through 12/11. Consulted CTS. 11/14: Denies complaints. Tachy this AM and BP is a little up.  Assessment & Plan:   Principal Problem:   MRSA bacteremia Active Problems:   Sepsis ([place])   Pancreatitis   Pancreatic pseudocyst   Cirrhosis ([place])   Diabetes mellitus ([place])   Alcohol abuse  Sepsis with MRSA bacteremia Endocarditis - on IV vancomycin (see below), and PO augmentin (through 11/19)  - Cultures are negative. He has been afebrile overall begin to de-escalate the antibiotics per pharmacy - PER PP:IRJJ bacteremia - on vancomycin and repeat blood cultures with clearance. Needs TEE to rule out endocarditis but not getting it done here. Still awaiting transfer to [place]. Will need 6 weeks of IV vancomycin  through 05/19/19 as TEE is positive for vegetation on TV     - CTS consulted; appreciate assistance, no surgical intervention at this time.  Pancreatic pseudocyst - continue augmentin through 11/19 per ID - GI rec cystogastrostomy w/ [facility] in [place]  Ileus and gastric outlet obstruction likely due to enlarging part of pancreatic pseudocyst, - NG tube was removed  - TPN is stopped and he is on low residue oral diet; tolerating, monitor  Hypokalemia Hypomagnesemia -K+ resolved - Mg2+ low; add PO Mg2+  History of alcohol abuse  - last drink was 2 months ago - thiamine, MVI  Hypertension Sinus tach - metoprolol 50 XL;  tele     - add norvasc  Pancreatic pseudocyst - GI recommended to advance diet to low residue low-fat diet if he continues to tolerate liquids and also to avoid opiates. - diet advanced; TPN d/c'd  Severe protein calorie malnutrition with hypoalbuminemia. - encourage diet  Normocytic anemia -Hgb was 6.8 yesterday; 1 unit pRBCs given, good response     - stable, monitor  HH won't be able to see him til Monday in [place]. Will discharge then.  DVT prophylaxis: lovenox Code Status: FULL   Disposition Plan: TBD  Consultants:   GI  ID  Cardiology  Antimicrobials:  . Vanc, augmentin   ROS:  Denies weakness, dyspnea, N, V, CP. Remainder 10-pt ROS is negative for all not previously mentioned.  Subjective: "So I'm going soon?"  Objective: Vitals:   04/22/19 0500 04/22/19 0527 04/22/19 0823 04/22/19 1153  BP: (!) 153/106  (!) 140/110 (!) 146/107  Pulse: (!) 105  (!) 107 (!) 105  Resp: 20  18 18   Temp: 98.4 F (36.9 C)  98.4 F (36.9 C) 98.3 F (36.8 C)  TempSrc: Oral  Oral Oral  SpO2: 100%  100% 99%  Weight:  91 kg    Height:        Intake/Output Summary (Last 24 hours) at 04/22/2019 1248 Last data filed at 04/22/2019 1153 Gross per 24 hour  Intake 3012.28 ml  Output 2755 ml  Net 257.28 ml   Filed Weights   04/21/19 0412 04/21/19 0920 04/22/19 0527  Weight: 93.5 kg 93.5 kg 91 kg    Examination:  General: 48 y.o. male resting in  bed in NAD Cardiovascular: tachy, +S1, S2, no m/g/r, equal pulses throughout Respiratory: CTABL, no w/r/r, normal WOB GI: BS+, NDNT, no masses noted, no organomegaly noted MSK: No e/c/c Neuro: alert to name, follows commands Psyc: Appropriate interaction and affect, calm/cooperative   Data Reviewed: I have personally reviewed following labs and imaging studies.  CBC: Recent Labs  Lab 04/17/19 0843 04/18/19 0315 04/19/19 1145 04/20/19 0320 04/21/19 0424 04/22/19 0306  WBC 7.9 8.1 6.4 7.4 6.1 5.7   NEUTROABS 5.8  --  4.4 5.0 4.0 3.6  HGB 7.0* 7.0* 6.8* 8.1* 7.7* 7.7*  HCT 24.1* 23.3* 23.1* 27.1* 26.0* 25.3*  MCV 82.8 81.2 79.9* 81.6 81.0 78.8*  PLT 426* 384 375 368 325 316   Basic Metabolic Panel: Recent Labs  Lab 04/17/19 0613 04/19/19 1145 04/20/19 0320 04/21/19 0423 04/22/19 0306  NA 139 138 139 137 136  K 3.4* 3.7 3.9 3.9 4.0  CL 108 108 110 104 106  CO2 27 25 24 24 24   GLUCOSE 128* 97 90 93 97  BUN 7 6 6  5* 5*  CREATININE 0.70 0.67 0.77 0.74 0.78  CALCIUM 7.6* 7.7* 8.0* 7.9* 8.0*  MG 1.7 1.5* 1.4* 1.5* 1.6*  PHOS 2.5 3.4 3.5 3.6 3.8   GFR: Estimated Creatinine Clearance: 121.3 mL/min (by C-G formula based on SCr of 0.78 mg/dL). Liver Function Tests: Recent Labs  Lab 04/17/19 0613 04/19/19 1145 04/20/19 0320 04/21/19 0423 04/22/19 0306  AST 30  --  56*  --   --   ALT 11  --  17  --   --   ALKPHOS 70  --  121  --   --   BILITOT 0.5  --  0.3  --   --   PROT 6.4*  --  7.4  --   --   ALBUMIN 1.3* 1.4* 1.7* 1.7* 1.6*   No results for input(s): LIPASE, AMYLASE in the last 168 hours. No results for input(s): AMMONIA in the last 168 hours. Coagulation Profile: No results for input(s): INR, PROTIME in the last 168 hours. Cardiac Enzymes: No results for input(s): CKTOTAL, CKMB, CKMBINDEX, TROPONINI in the last 168 hours. BNP (last 3 results) No results for input(s): PROBNP in the last 8760 hours. HbA1C: No results for input(s): HGBA1C in the last 72 hours. CBG: Recent Labs  Lab 04/21/19 1127 04/21/19 1644 04/22/19 0158 04/22/19 0526 04/22/19 1151  GLUCAP 71 91 87 83 76   Lipid Profile: No results for input(s): CHOL, HDL, LDLCALC, TRIG, CHOLHDL, LDLDIRECT in the last 72 hours. Thyroid Function Tests: No results for input(s): TSH, T4TOTAL, FREET4, T3FREE, THYROIDAB in the last 72 hours. Anemia Panel: No results for input(s): VITAMINB12, FOLATE, FERRITIN, TIBC, IRON, RETICCTPCT in the last 72 hours. Sepsis Labs: No results for input(s): PROCALCITON,  LATICACIDVEN in the last 168 hours.  Recent Results (from the past 240 hour(s))  SARS CORONAVIRUS 2 (TAT 6-24 HRS) Nasopharyngeal Nasopharyngeal Swab     Status: None   Collection Time: 04/13/19 12:43 PM   Specimen: Nasopharyngeal Swab  Result Value Ref Range Status   SARS Coronavirus 2 NEGATIVE NEGATIVE Final    Comment: (NOTE) SARS-CoV-2 target nucleic acids are NOT DETECTED. The SARS-CoV-2 RNA is generally detectable in upper and lower respiratory specimens during the acute phase of infection. Negative results do not preclude SARS-CoV-2 infection, do not rule out co-infections with other pathogens, and should not be used as the sole basis for treatment or other patient management decisions. Negative results must be combined  with clinical observations, patient history, and epidemiological information. The expected result is Negative. Fact Sheet for Patients: [employer]no Fact Sheet for Healthcare Providers: quierodirigir.com This test is not yet approved or cleared by the [place] FDA and  has been authorized for detection and/or diagnosis of SARS-CoV-2 by FDA under an Emergency Use Authorization (EUA). This EUA will remain  in effect (meaning this test can be used) for the duration of the COVID-19 declaration under Section 56 4(b)(1) of the Act, 21 U.S.C. section 360bbb-3(b)(1), unless the authorization is terminated or revoked sooner. Performed at [facility], [address], [place], [place] [address]       Radiology Studies: No results found.   Scheduled Meds: . amoxicillin-clavulanate  1 tablet Oral Q12H  . Chlorhexidine Gluconate Cloth  6 each Topical Daily  . Chlorhexidine Gluconate Cloth  6 each Topical Daily  . enoxaparin (LOVENOX) injection  40 mg Subcutaneous Q24H  . feeding supplement (ENSURE ENLIVE)  237 mL Oral BID BM  . folic acid  1 mg Oral Daily  . insulin aspart  0-9 Units Subcutaneous  Q6H  . metoprolol succinate  50 mg Oral Daily  . multivitamin with minerals  1 tablet Oral Daily  . mupirocin ointment   Nasal BID  . pantoprazole (PROTONIX) IV  40 mg Intravenous Q12H  . sodium chloride flush  10-40 mL Intracatheter Q12H  . thiamine  100 mg Oral Daily   Continuous Infusions: . sodium chloride 20 mL/hr at 04/21/19 1814  . vancomycin       LOS: 16 days    Time spent: 25 minutes spent in the coordination of care today.    Teddy Spike, DO [facility] Pager [phone]  If 7PM-7AM, please contact night-coverage [email or web address] Password [facility] 04/22/2019, 12:48 PM

## 2019-04-23 LAB — GLUCOSE, CAPILLARY
Glucose-Capillary: 82 mg/dL (ref 70–99)
Glucose-Capillary: 85 mg/dL (ref 70–99)
Glucose-Capillary: 83 mg/dL (ref 70–99)
Glucose-Capillary: 90 mg/dL (ref 70–99)

## 2019-04-23 LAB — RENAL FUNCTION PANEL
Albumin: 1.8 g/dL — ABNORMAL LOW (ref 3.5–5.0)
Anion gap: 6 (ref 5–15)
BUN: <5 mg/dL — ABNORMAL LOW (ref 6–20)
CO2: 24 mmol/L (ref 22–32)
Calcium: 8.2 mg/dL — ABNORMAL LOW (ref 8.9–10.3)
Chloride: 107 mmol/L (ref 98–111)
Creatinine, Ser: 0.75 mg/dL (ref 0.61–1.24)
GFR calc Af Amer: >60 mL/min (ref >60)
GFR calc non Af Amer: >60 mL/min (ref >60)
Glucose, Bld: 94 mg/dL (ref 70–99)
Phosphorus: 3.7 mg/dL (ref 2.5–4.6)
Potassium: 3.6 mmol/L (ref 3.5–5.1)
Sodium: 137 mmol/L (ref 135–145)

## 2019-04-23 LAB — MAGNESIUM: Magnesium: 1.5 mg/dL — ABNORMAL LOW (ref 1.7–2.4)

## 2019-04-23 MED ORDER — HYDROMORPHONE HCL 1 MG/ML IJ SOLN
0.5000 mg | INTRAMUSCULAR | Status: DC | PRN
  Administered 2019-04-23 – 2019-04-24 (×4): 0.5 mg via INTRAVENOUS
  Filled 2019-04-23 (×4): qty 0.5

## 2019-04-23 MED ORDER — AMLODIPINE BESYLATE 10 MG PO TABS
10.0000 mg | ORAL_TABLET | Freq: Every day | ORAL | Status: DC
  Administered 2019-04-24: 10 mg via ORAL
  Filled 2019-04-23: qty 1

## 2019-04-23 MED ORDER — AMOXICILLIN-POT CLAVULANATE 875-125 MG PO TABS: 1.0000 | ORAL_TABLET | Freq: Two times a day (BID) | ORAL | 0 refills | Status: AC

## 2019-04-23 MED ORDER — METOPROLOL SUCCINATE ER 50 MG PO TB24: 50.0000 mg | ORAL_TABLET | Freq: Every day | ORAL | 0 refills | Status: AC

## 2019-04-23 MED ORDER — AMLODIPINE BESYLATE 10 MG PO TABS: 10.0000 mg | ORAL_TABLET | Freq: Every day | ORAL | 0 refills | Status: AC

## 2019-04-23 MED ORDER — OXYCODONE-ACETAMINOPHEN 7.5-325 MG PO TABS
1.0000 | ORAL_TABLET | Freq: Four times a day (QID) | ORAL | Status: DC | PRN
  Administered 2019-04-23 – 2019-04-24 (×4): 1 via ORAL
  Filled 2019-04-23 (×4): qty 1

## 2019-04-23 MED ORDER — OXYCODONE-ACETAMINOPHEN 7.5-325 MG PO TABS: 1.0000 | ORAL_TABLET | Freq: Four times a day (QID) | ORAL | 0 refills | Status: AC | PRN

## 2019-04-23 MED ORDER — VANCOMYCIN IV (FOR PTA / DISCHARGE USE ONLY): 1000.0000 mg | Freq: Two times a day (BID) | INTRAVENOUS | 0 refills | Status: AC

## 2019-04-23 NOTE — Progress Notes (Signed)
Charles Calhoun.  PROGRESS NOTE    Charles Calhoun  [id number]:[id number] DOB: [birth year] DOA: 04/06/2019 PCP: System, Pcp Not In   Brief Narrative:   48 year old male with history of alcohol abuse also potential alcoholic pancreatitis who was admitted with possible infected pancreatic pseudocyst is transferred from another facility where he presented with nausea weakness and fever and abdominal pain  11/12: Doing ok ON. Denies complaints. On the schedule for TEE tomorrow. 11/13: TEE w/ 1.3cm TV vegetation. Spoke with ID. Abx through 12/11. Consulted CTS. 11/14: Denies complaints. Tachy this AM and BP is a little up. 11/15: Continue norvasc. Transitioning to PO pain meds. Home tomorrow.    Assessment & Plan:   Principal Problem:   MRSA bacteremia Active Problems:   Sepsis (HCC)   Pancreatitis   Pancreatic pseudocyst   Cirrhosis (HCC)   Diabetes mellitus (HCC)   Alcohol abuse  Sepsis with MRSA bacteremia Endocarditis - on IV vancomycin (see below), and PO augmentin (through 11/19)  - Cultures are negative. He has been afebrile overall begin to de-escalate the antibiotics per pharmacy - PER EP:PIRJ:MRSA bacteremia - on vancomycin and repeat blood cultures with clearance. Needs TEE to rule out endocarditis but not getting it done here. Still awaiting transfer to [place]. Will need 6 weeks of IV vancomycin through 05/19/19 as TEE is positive for vegetation on TV - CTS consulted; appreciate assistance, no surgical intervention at this time.  Pancreatic pseudocyst - continue augmentin through 11/19 per ID - GI rec cystogastrostomy w/ [facility] in [place]  Ileus and gastric outlet obstruction likely due to enlarging part of pancreatic pseudocyst, - NG tube was removed  - TPN is stopped and he is on low residue oral diet; tolerating, monitor  Hypokalemia Hypomagnesemia -replaced, follow  History of alcohol abuse  - last drink was 2 months ago - thiamine, MVI   Hypertension Sinus tach - metoprolol 50 XL, amlodipine 10     - tele  Pancreatic pseudocyst - GI recommended to advance diet to low residue low-fat diet if he continues to tolerate liquids and also to avoid opiates. - diet advanced; TPN d/c'd  Severe protein calorie malnutrition with hypoalbuminemia. - encourage diet  Normocytic anemia -Hgb was 6.8 11/11; 1 unit pRBCs given,good response - stable, monitor  [facility]. PICC in place.   DVT prophylaxis: lovenox Code Status: FULL   Disposition Plan: TBD  Consultants:   GI  ID  Cardiology  CTS  Antimicrobials:  . Vanc, augmentin   ROS:  Denies CP, dyspnea, N, V. Reports some ab pain . Remainder 10-pt ROS is negative for all not previously mentioned.  Subjective: "They'll do all this at home?"  Objective: Vitals:   04/22/19 2100 04/22/19 2319 04/23/19 0452 04/23/19 0922  BP: (!) 134/104 (!) 142/100 (!) 140/101 (!) 142/103  Pulse: (!) 108 (!) 111 (!) 105 (!) 106  Resp: 18 18 18 16   Temp: 98.4 F (36.9 C) 98.8 F (37.1 C) 98.5 F (36.9 C) 98.1 F (36.7 C)  TempSrc: Axillary Oral Oral Oral  SpO2: 100% 99% 99% 100%  Weight:   89.8 kg   Height:        Intake/Output Summary (Last 24 hours) at 04/23/2019 1011 Last data filed at 04/23/2019 0824 Gross per 24 hour  Intake 1240 ml  Output 3250 ml  Net -2010 ml   Filed Weights   04/21/19 0920 04/22/19 0527 04/23/19 0452  Weight: 93.5 kg 91 kg 89.8 kg    Examination:  General:  48 y.o. male resting in bed in NAD Cardiovascular: tachy, +S1, S2, no m/g/r Respiratory: CTABL, no w/r/r GI: BS+, distended, soft, TTP LLQ MSK: No e/c/c Neuro: A&O x 3, no focal deficits Psyc: Appropriate interaction and affect, calm/cooperative   Data Reviewed: I have personally reviewed following labs and imaging studies.  CBC: Recent Labs  Lab 04/17/19 0843 04/18/19 0315 04/19/19 1145 04/20/19 0320 04/21/19 0424 04/22/19  0306  WBC 7.9 8.1 6.4 7.4 6.1 5.7  NEUTROABS 5.8  --  4.4 5.0 4.0 3.6  HGB 7.0* 7.0* 6.8* 8.1* 7.7* 7.7*  HCT 24.1* 23.3* 23.1* 27.1* 26.0* 25.3*  MCV 82.8 81.2 79.9* 81.6 81.0 78.8*  PLT 426* 384 375 368 325 998   Basic Metabolic Panel: Recent Labs  Lab 04/17/19 0613 04/19/19 1145 04/20/19 0320 04/21/19 0423 04/22/19 0306  NA 139 138 139 137 136  K 3.4* 3.7 3.9 3.9 4.0  CL 108 108 110 104 106  CO2 27 25 24 24 24   GLUCOSE 128* 97 90 93 97  BUN 7 6 6  5* 5*  CREATININE 0.70 0.67 0.77 0.74 0.78  CALCIUM 7.6* 7.7* 8.0* 7.9* 8.0*  MG 1.7 1.5* 1.4* 1.5* 1.6*  PHOS 2.5 3.4 3.5 3.6 3.8   GFR: Estimated Creatinine Clearance: 121.3 mL/min (by C-G formula based on SCr of 0.78 mg/dL). Liver Function Tests: Recent Labs  Lab 04/17/19 0613 04/19/19 1145 04/20/19 0320 04/21/19 0423 04/22/19 0306  AST 30  --  56*  --   --   ALT 11  --  17  --   --   ALKPHOS 70  --  121  --   --   BILITOT 0.5  --  0.3  --   --   PROT 6.4*  --  7.4  --   --   ALBUMIN 1.3* 1.4* 1.7* 1.7* 1.6*   No results for input(s): LIPASE, AMYLASE in the last 168 hours. No results for input(s): AMMONIA in the last 168 hours. Coagulation Profile: No results for input(s): INR, PROTIME in the last 168 hours. Cardiac Enzymes: No results for input(s): CKTOTAL, CKMB, CKMBINDEX, TROPONINI in the last 168 hours. BNP (last 3 results) No results for input(s): PROBNP in the last 8760 hours. HbA1C: No results for input(s): HGBA1C in the last 72 hours. CBG: Recent Labs  Lab 04/22/19 0526 04/22/19 1151 04/22/19 1653 04/22/19 2310 04/23/19 0619  GLUCAP 83 76 92 82 85   Lipid Profile: No results for input(s): CHOL, HDL, LDLCALC, TRIG, CHOLHDL, LDLDIRECT in the last 72 hours. Thyroid Function Tests: No results for input(s): TSH, T4TOTAL, FREET4, T3FREE, THYROIDAB in the last 72 hours. Anemia Panel: No results for input(s): VITAMINB12, FOLATE, FERRITIN, TIBC, IRON, RETICCTPCT in the last 72 hours. Sepsis Labs:  No results for input(s): PROCALCITON, LATICACIDVEN in the last 168 hours.  Recent Results (from the past 240 hour(s))  SARS CORONAVIRUS 2 (TAT 6-24 HRS) Nasopharyngeal Nasopharyngeal Swab     Status: None   Collection Time: 04/13/19 12:43 PM   Specimen: Nasopharyngeal Swab  Result Value Ref Range Status   SARS Coronavirus 2 NEGATIVE NEGATIVE Final    Comment: (NOTE) SARS-CoV-2 target nucleic acids are NOT DETECTED. The SARS-CoV-2 RNA is generally detectable in upper and lower respiratory specimens during the acute phase of infection. Negative results do not preclude SARS-CoV-2 infection, do not rule out co-infections with other pathogens, and should not be used as the sole basis for treatment or other patient management decisions. Negative results must be combined with clinical  observations, patient history, and epidemiological information. The expected result is Negative. Fact Sheet for Patients: HairSlick.no Fact Sheet for Healthcare Providers: quierodirigir.com This test is not yet approved or cleared by the [place] FDA and  has been authorized for detection and/or diagnosis of SARS-CoV-2 by FDA under an Emergency Use Authorization (EUA). This EUA will remain  in effect (meaning this test can be used) for the duration of the COVID-19 declaration under Section 56 4(b)(1) of the Act, 21 U.S.C. section 360bbb-3(b)(1), unless the authorization is terminated or revoked sooner. Performed at [facility], [address], [place], [place] [address]       Radiology Studies: No results found.   Scheduled Meds: . [START ON 04/24/2019] amLODipine  10 mg Oral Daily  . amoxicillin-clavulanate  1 tablet Oral Q12H  . Chlorhexidine Gluconate Cloth  6 each Topical Daily  . Chlorhexidine Gluconate Cloth  6 each Topical Daily  . enoxaparin (LOVENOX) injection  40 mg Subcutaneous Q24H  . feeding supplement (ENSURE ENLIVE)   237 mL Oral BID BM  . folic acid  1 mg Oral Daily  . insulin aspart  0-9 Units Subcutaneous Q6H  . magnesium oxide  400 mg Oral BID  . metoprolol succinate  50 mg Oral Daily  . multivitamin with minerals  1 tablet Oral Daily  . mupirocin ointment   Nasal BID  . pantoprazole (PROTONIX) IV  40 mg Intravenous Q12H  . sodium chloride flush  10-40 mL Intracatheter Q12H  . thiamine  100 mg Oral Daily   Continuous Infusions: . sodium chloride 20 mL/hr at 04/21/19 1814  . vancomycin 1,000 mg (04/23/19 0243)     LOS: 17 days    Time spent: 25 minutes spent in the coordination of care today.    Teddy Spike, DO [facility] Pager [phone]  If 7PM-7AM, please contact night-coverage [email or web address] Password [email or web address] 04/23/2019, 10:11 AM

## 2019-04-24 ENCOUNTER — Encounter (HOSPITAL_COMMUNITY): Payer: Self-pay | Admitting: Cardiovascular Disease

## 2019-04-24 DIAGNOSIS — F1010 Alcohol abuse, uncomplicated: Secondary | ICD-10-CM

## 2019-04-24 DIAGNOSIS — E119 Type 2 diabetes mellitus without complications: Secondary | ICD-10-CM

## 2019-04-24 LAB — GLUCOSE, CAPILLARY: Glucose-Capillary: 98 mg/dL (ref 70–99)

## 2019-04-24 LAB — CBC WITH DIFFERENTIAL/PLATELET
Abs Immature Granulocytes: 0.02 10*3/uL (ref 0.00–0.07)
Basophils Absolute: 0 10*3/uL (ref 0.0–0.1)
Basophils Relative: 1 %
Eosinophils Absolute: 0.1 10*3/uL (ref 0.0–0.5)
Eosinophils Relative: 2 %
HCT: 25 % — ABNORMAL LOW (ref 39.0–52.0)
Hemoglobin: 7.5 g/dL — ABNORMAL LOW (ref 13.0–17.0)
Immature Granulocytes: 0 %
Lymphocytes Relative: 21 %
Lymphs Abs: 1.2 10*3/uL (ref 0.7–4.0)
MCH: 24.2 pg — ABNORMAL LOW (ref 26.0–34.0)
MCHC: 30 g/dL (ref 30.0–36.0)
MCV: 80.6 fL (ref 80.0–100.0)
Monocytes Absolute: 0.8 10*3/uL (ref 0.1–1.0)
Monocytes Relative: 14 %
Neutro Abs: 3.5 10*3/uL (ref 1.7–7.7)
Neutrophils Relative %: 62 %
Platelets: 291 10*3/uL (ref 150–400)
RBC: 3.1 MIL/uL — ABNORMAL LOW (ref 4.22–5.81)
RDW: 19.5 % — ABNORMAL HIGH (ref 11.5–15.5)
WBC: 5.7 10*3/uL (ref 4.0–10.5)
nRBC: 0 % (ref 0.0–0.2)

## 2019-04-24 LAB — COMPREHENSIVE METABOLIC PANEL
ALT: 16 U/L (ref 0–44)
AST: 40 U/L (ref 15–41)
Albumin: 1.8 g/dL — ABNORMAL LOW (ref 3.5–5.0)
Alkaline Phosphatase: 115 U/L (ref 38–126)
Anion gap: 7 (ref 5–15)
BUN: 5 mg/dL — ABNORMAL LOW (ref 6–20)
CO2: 24 mmol/L (ref 22–32)
Calcium: 8.1 mg/dL — ABNORMAL LOW (ref 8.9–10.3)
Chloride: 106 mmol/L (ref 98–111)
Creatinine, Ser: 0.9 mg/dL (ref 0.61–1.24)
GFR calc Af Amer: >60 mL/min (ref >60)
GFR calc non Af Amer: >60 mL/min (ref >60)
Glucose, Bld: 95 mg/dL (ref 70–99)
Potassium: 3.7 mmol/L (ref 3.5–5.1)
Sodium: 137 mmol/L (ref 135–145)
Total Bilirubin: 0.7 mg/dL (ref 0.3–1.2)
Total Protein: 7.5 g/dL (ref 6.5–8.1)

## 2019-04-24 LAB — PHOSPHORUS: Phosphorus: 3.9 mg/dL (ref 2.5–4.6)

## 2019-04-24 LAB — MAGNESIUM: Magnesium: 1.5 mg/dL — ABNORMAL LOW (ref 1.7–2.4)

## 2019-04-24 LAB — TRIGLYCERIDES: Triglycerides: 71 mg/dL (ref <150)

## 2019-04-24 LAB — PREALBUMIN: Prealbumin: 9.4 mg/dL — ABNORMAL LOW (ref 18–38)

## 2019-04-24 MED ORDER — HEPARIN SOD (PORK) LOCK FLUSH 100 UNIT/ML IV SOLN
250.0000 [IU] | INTRAVENOUS | Status: AC | PRN
  Administered 2019-04-24: 250 [IU]
  Filled 2019-04-24: qty 2.5

## 2019-04-24 MED ORDER — MAGNESIUM OXIDE 400 (241.3 MG) MG PO TABS: 400.0000 mg | ORAL_TABLET | Freq: Two times a day (BID) | ORAL | 0 refills | Status: AC

## 2019-04-24 MED FILL — OXYCODON-ACETAMINOPHEN 7.5-: 7.5-325 | 5 days supply | Qty: 20 | Fill #0

## 2019-04-24 MED FILL — AMLODIPINE BESYLATE 10 MG T: 10 | 30 days supply | Qty: 30 | Fill #0

## 2019-04-24 MED FILL — AMOX-CLAV 875-125 MG TABLET: 875-125 | 4 days supply | Qty: 8 | Fill #0

## 2019-04-24 MED FILL — MAGNESIUM OXIDE 400 MG TABS: 400 | 5 days supply | Qty: 10 | Fill #0

## 2019-04-24 MED FILL — METOPROLOL SUCCINATE ER 50: 50 | 30 days supply | Qty: 30 | Fill #0

## 2019-04-24 NOTE — TOC Transition Note (Addendum)
Transition of Care [facility]) - CM/SW Discharge Note   Patient Details  Name: Charles Calhoun MRN: [id number] Date of Birth: 04-15-1971  [facility] CM/SW Contact:  Zenon Mayo, RN Phone Number: 04/24/2019, 12:53 PM   Clinical Narrative:    Patient is for dc today, his mom is transporting him home today.  TOC will fill his meds for him for 20.00.  NCM notified [facility] that patient is for dc today, he will get his 2 pm iv abx here at the hospital and the [facility] will see him in the am.  Pam with [facility] will supply medications,  Per Pam they will deliver medications tonight between 7-9 pm for first home dose tomorrow 8-9 am.  NCM confirmed with [facility] that they received the [facility] orders.  He has no other needs. Patient has PCP in [place], Dr. Manfred Shirts.   Final next level of care: [place] Barriers to Discharge: No Barriers Identified   Patient Goals and CMS Choice Patient states their goals for this hospitalization and ongoing recovery are:: home with [facility] CMS Medicare.gov Compare Post Acute Care list provided to:: Patient Choice offered to / list presented to : Patient  Discharge Placement                       Discharge Plan and Services                DME Arranged: (NA)         HH Arranged: RN, IV Antibiotics HH Agency: ([place]) Date [place]: 04/24/19 Time [place]: 1253 Representative spoke with at [place]: [place]) Interventions     Readmission Risk Interventions No flowsheet data found.

## 2019-04-24 NOTE — Progress Notes (Signed)
   Vital Signs MEWS/VS Documentation      04/24/2019 0500 04/24/2019 0700 04/24/2019 0836 04/24/2019 1014   MEWS Score:  1  0  2  2   MEWS Score Color:  Green  Green  Yellow  Yellow   Resp:  -  -  18  -   Pulse:  -  -  (!) 115  (!) 112   BP:  -  -  (!) 141/99  (!) 138/102   Temp:  -  -  98 F (36.7 C)  -   O2 Device:  -  -  Room Air  -      MD aware of pt's heart rate.     Mayela Bullard G Naydene Kamrowski 04/24/2019,10:24 AM

## 2019-04-24 NOTE — Discharge Summary (Signed)
. Physician Discharge Summary  Goldman Birchall TMH:[id number] DOB: [birth year] DOA: 04/06/2019  PCP: System, Pcp Not In  Admit date: 04/06/2019 Discharge date: 04/24/2019  Admitted From: Home Disposition:  Discharged to home with [facility]  Recommendations for Outpatient Follow-up:  1. Follow up with PCP in 5 - 7 days. 2. Please obtain BMP/CBC in one week. 3. Follow up with GI at [facility] for assessment of pseudocyst. 4. Follow up with CTS at [facility] after abx are complete for assessment of TV vegetation.  Discharge Condition: Stable  CODE STATUS: FULL   Brief/Interim Summary: 48 year old male with history of alcohol abuse also potential alcoholic pancreatitis who was admitted with possible infected pancreatic pseudocyst is transferred from another facility where he presented with nausea weakness and fever and abdominal pain. Found to have MRSA bacteremia w/ vegetation.   11/16: Denies complaints. Eager to go home.    Discharge Diagnoses:  Principal Problem:   MRSA bacteremia Active Problems:   Sepsis ([place])   Pancreatitis   Pancreatic pseudocyst   Cirrhosis ([place])   Diabetes mellitus ([place])   Alcohol abuse  Sepsis with MRSA bacteremia Endocarditis - on IV vancomycin (see below), and PO augmentin (through 11/19)  - Cultures are negative. He has been afebrile overall begin to de-escalate the antibiotics per pharmacy - PER XQ:JJHE bacteremia - on vancomycin and repeat blood cultures with clearance. Needs TEE to rule out endocarditis but not getting it done here. Still awaiting transfer to [place]. 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 Etiology may be his prior PICC line which has been changed. His sepsis has resolved and follow up [facility] were negative. There is no indication for  surgical treatment of his tricuspid endocarditis and would treat with a full 6 wk course of antibiotics with a follow up echo afterward. I don't think his pseudocysts are infected because he would not have gotten better with antibiotics alone.     - Follow up with PCP/CTS at home.  Pancreatic pseudocyst - GI recommended to advance diet to low residue low-fat diet if he continues to tolerate liquids and also to minimize opiates. - diet advanced; TPN d/c'd - continue augmentin through 11/19 per ID - GI rec cystogastrostomy w/ [facility] in [place]  Ileus and gastric outlet obstruction likely due to enlarging part of pancreatic pseudocyst, - NG tube was removed  - TPN is stopped and he is on low residue oral diet; tolerating, monitor  Hypokalemia Hypomagnesemia -replaced, follow  History of alcohol abuse  - last drink was 2 months ago - thiamine, MVI  Hypertension Sinus tach - metoprolol50 XL, amlodipine 10     - tele     - continue follow up with PCP for titration of medications.  Severe protein calorie malnutrition with hypoalbuminemia. - encourage diet  Normocytic anemia -Hgb was 6.8 11/11; 1 unit pRBCs given,good response - stable, monitor  Discharge Instructions  Discharge Instructions    Home infusion instructions [facility] May follow [place] Dosing Protocol; May administer Cathflo as needed to maintain patency of vascular access device.; Flushing of vascular access device: per [facility] Protocol: 0.9% NaCl pre/post medica...   Complete by: As directed    Instructions: May follow [place] Dosing Protocol   Instructions: May administer Cathflo as needed to maintain patency of vascular access device.  Instructions: Flushing of vascular access device: per [facility] Protocol: 0.9% NaCl pre/post medication administration and prn patency; Heparin 100 u/ml, 21m for implanted ports and Heparin 10u/ml, 546mfor all other  central venous catheters.   Instructions: May follow [facility] Anaphylaxis Protocol for First Dose Administration in the home: 0.9% NaCl at 25-50 ml/hr to maintain IV access for protocol meds. Epinephrine 0.3 ml IV/IM PRN and Benadryl 25-50 IV/IM PRN s/s of anaphylaxis.   Instructions: [facility] Coordinator (RN) to assist per patient IV care needs in the home PRN.     Allergies as of 04/24/2019   No Known Allergies     Medication List    STOP taking these medications   PRESCRIPTION MEDICATION     TAKE these medications   acetaminophen 325 MG tablet Commonly known as: TYLENOL Take 650 mg by mouth every 6 (six) hours as needed for mild pain, fever or headache.   amLODipine 10 MG tablet Commonly known as: NORVASC Take 1 tablet (10 mg total) by mouth daily.   amoxicillin-clavulanate 875-125 MG tablet Commonly known as: AUGMENTIN Take 1 tablet by mouth 2 (two) times daily for 4 days.   folic acid 40563CG tablet Commonly known as: FOLVITE Take 400 mcg by mouth daily.   metoprolol succinate 50 MG 24 hr tablet Commonly known as: TOPROL-XL Take 1 tablet (50 mg total) by mouth daily. Take with or immediately following a meal. What changed:   medication strength  how much to take  additional instructions   ondansetron 4 MG tablet Commonly known as: ZOFRAN Take 8 mg by mouth every 8 (eight) hours as needed for nausea/vomiting.   oxyCODONE-acetaminophen 7.5-325 MG tablet Commonly known as: PERCOCET Take 1 tablet by mouth every 6 (six) hours as needed for up to 5 days for moderate pain or severe pain.   vancomycin  IVPB Inject 1,000 mg into the vein every 12 (twelve) hours for 28 days. Indication:  MRSA bacteremia and endocarditis Last Day of Therapy:  05/19/2019 Labs - 'Sunday/Monday:  CBC/D, BMP, and vancomycin trough. Labs - Thursday:  BMP and vancomycin trough Labs - Every other week:  ESR and CRP            Home Infusion Instuctions  (From admission,  onward)         Start     Ordered   04/23/19 0000  Home infusion instructions [facility] May follow [facility] Dosing Protocol; May administer Cathflo as needed to maintain patency of vascular access device.; Flushing of vascular access device: per [facility] Protocol: 0.9% NaCl pre/post medica...    Question Answer Comment  Instructions May follow [facility] Dosing Protocol   Instructions May administer Cathflo as needed to maintain patency of vascular access device.   Instructions Flushing of vascular access device: per [facility] Protocol: 0.9% NaCl pre/post medication administration and prn patency; Heparin 100 u/ml, 5ml for implanted ports and Heparin 10u/ml, 5ml for all other central venous catheters.   Instructions May follow [facility] Anaphylaxis Protocol for First Dose Administration in the home: 0.9% NaCl at 25-50 ml/hr to maintain IV access for protocol meds. Epinephrine 0.3 ml IV/IM PRN and Benadryl 25-50 IV/IM PRN s/s of anaphylaxis.   Instructions [facility] Infusion Coordinator (RN) to assist per patient IV care needs in the home PRN.      11' /15/20 1019          No Known Allergies  Consultations:  ID  GI  CTS  Cardiology  Procedures/Studies: Ct Abdomen Pelvis W Wo Contrast  Result Date: 04/08/2019 CLINICAL DATA:  Acute pancreatitis. Fever and leukocytosis. Sepsis. Cirrhosis. EXAM: CT ABDOMEN AND PELVIS WITHOUT AND WITH CONTRAST TECHNIQUE: Multidetector CT imaging of the abdomen and pelvis was performed following the standard protocol before and following the bolus administration of intravenous contrast. CONTRAST:  116m OMNIPAQUE IOHEXOL 300 MG/ML  SOLN COMPARISON:  None. FINDINGS: Lower Chest: Small right pleural effusion and dependent bibasilar atelectasis. Hepatobiliary: Hepatic cirrhosis is demonstrated. Tiny cysts seen in central right hepatic lobe. No liver masses identified. Portal and hepatic veins are patent. Recanalization of paraumbilical veins is  consistent with portal venous hypertension. Pancreas: Mild-to-moderate acute pancreatitis is seen. No evidence of pancreatic mass or calcifications. A rim enhancing fluid collection is seen along the superior and anterior portion of the pancreas which measures 9.8 x 6.2 cm. A smaller rim enhancing fluid collection is seen along the posterior aspect of the pancreatic head which measures 5.6 x 3.5 cm. A smaller 2 cm rim enhancing fluid collection is seen along the posterior aspect of the pancreatic tail. These are consistent with pancreatic pseudocysts. Spleen: Mild splenomegaly measuring approximately 14 cm in length, consistent with portal venous hypertension. No evidence of splenic vein thrombosis. Portosystemic venous collaterals noted in the left upper quadrant, consistent with portal venous hypertension. Multiple small ill-defined less than 1 cm low-attenuation lesions are seen in the posterior aspect of the spleen, which are nonspecific. Differential considerations include small splenic infarcts and micro abscesses. Adrenals/Urinary Tract: No masses identified. No evidence of hydronephrosis. Stomach/Bowel: Nasogastric tube tip in body of stomach. No evidence of obstruction, inflammatory process or abnormal fluid collections. Vascular/Lymphatic: Shotty lymph nodes measuring up to 1 cm are seen in the retroperitoneum and central small bowel mesentery, likely reactive in etiology. No pseudoaneurysms visualized. Aortic atherosclerosis. No evidence of abdominal aortic aneurysm. Reproductive:  No mass or other significant abnormality. Other:  Mild abdominal ascites. Musculoskeletal:  No suspicious bone lesions identified. IMPRESSION: Mild-to-moderate acute pancreatitis. Several pancreatic pseudocysts, largest measuring 9.8 cm. No evidence of pseudoaneurysm or other complication. 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 No evidence of hepatic neoplasm.  Multiple sub-cm low-attenuation lesions in the posterior spleen which are nonspecific. Differential diagnosis includes small splenic infarcts and micro abscesses. a Shotty mesenteric and retroperitoneal lymphadenopathy, likely reactive in etiology. Recommend continued attention on follow-up imaging. Small right pleural effusion and dependent bibasilar atelectasis. Aortic Atherosclerosis (ICD10-I70.0). Electronically Signed   By: JMarlaine HindM.D.   On: 04/08/2019 13:16   Dg Abd 1 View  Result Date: 04/13/2019 CLINICAL DATA:  Abdominal pain, left lower quadrant, history of pancreatitis EXAM: ABDOMEN - 1 VIEW COMPARISON:  03/12/2019 FINDINGS: Limited assessment of the abdomen, excluding a portion of the right flank shows a nasogastric tube in the stomach. Mildly dilated loops of small bowel present in the left lower quadrant with similar appearance to prior exam. Mild-to-moderate colonic distension in the right hemiabdomen, stool and gas in the rectum. No acute bone finding. IMPRESSION: Mildly dilated loops of small and large bowel may reflect global ileus, attention on follow-up. Electronically Signed   By: GZetta BillsM.D.   On: 04/13/2019 12:11   Dg Abd 1 View  Result Date: 04/10/2019 CLINICAL DATA:  NG tube placement EXAM: ABDOMEN - 1 VIEW COMPARISON:  04/07/2019 FINDINGS: NG tube tip is in the proximal to mid stomach with the side port in the proximal stomach. IMPRESSION: NG tube tip in the proximal  to mid stomach. Electronically Signed   By: Rolm Baptise M.D.   On: 04/10/2019 19:14   Dg Chest Port 1 View  Result Date: 04/06/2019 CLINICAL DATA:  Tachypnea. EXAM: PORTABLE CHEST 1 VIEW COMPARISON:  None. FINDINGS: Enteric tube coiled in the stomach. Right upper extremity PICC line with the tip at the cavoatrial junction. The heart size and mediastinal contours are within normal limits. Normal pulmonary vascularity. Low lung volumes. Minimal atelectasis at the peripheral left lung base. No focal  consolidation, pleural effusion, or pneumothorax. No acute osseous abnormality. IMPRESSION: 1. Low lung volumes.  No active disease. Electronically Signed   By: Titus Dubin M.D.   On: 04/06/2019 18:21   Dg Abd Portable 1v  Result Date: 04/12/2019 CLINICAL DATA:  NG tube placement EXAM: PORTABLE ABDOMEN - 1 VIEW COMPARISON:  04/10/2019 FINDINGS: NG tube tip is in the proximal to mid stomach. IMPRESSION: NG tube tip in the proximal to mid stomach. Electronically Signed   By: Rolm Baptise M.D.   On: 04/12/2019 21:41   Dg Abd Portable 1v  Result Date: 04/08/2019 CLINICAL DATA:  NG tube placement EXAM: PORTABLE ABDOMEN - 1 VIEW COMPARISON:  04/07/2019 FINDINGS: The enteric tube projects over the gastric body. The tip is pointed distally. The bowel gas pattern is similar to prior study with distention of the cecum. No definite pneumatosis or free air identified on this exam. IMPRESSION: Enteric tube projects over the gastric body. Electronically Signed   By: Constance Holster M.D.   On: 04/08/2019 00:04   Dg Abd Portable 1v  Result Date: 04/07/2019 CLINICAL DATA:  Abdominal distension. EXAM: PORTABLE ABDOMEN - 1 VIEW COMPARISON:  None. FINDINGS: An NG tube is noted in the stomach. Relative paucity of bowel gas except for moderate cecal distention. The soft tissue shadows are maintained. No obvious free air. The bony structures are intact. 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 IMPRESSION: 1. NG tube in the stomach. 2. Moderate cecal distention. Could not exclude a cecal volvulus. CT may be helpful for further evaluation. 3. Right hip AVN. Electronically Signed   By: Marijo Sanes M.D.   On: 04/07/2019 05:20   [place] Ekg Site Rite  Result Date: 04/12/2019 If Site Rite image not attached, placement could not be confirmed due to current cardiac rhythm.  TEE (04/21/19)       IMPRESSIONS  1. Left ventricular ejection fraction, by visual estimation, is 60 to 65%. The left ventricle has  normal function. Normal left ventricular size. There is no left ventricular hypertrophy.  2. Global right ventricle has normal systolic function.The right ventricular size is normal. No increase in right ventricular wall thickness.  3. Left atrial size was normal.  4. Right atrial size was normal.  5. The mitral valve is normal in structure. No evidence of mitral valve regurgitation. Mild mitral stenosis.  6. Moderately sized vegetation on the tricuspid valve.  7. The tricuspid valve is abnormal. Tricuspid valve regurgitation is mild.  8. Mobile vegeation on lateral leaflet measures 1.3 cm in longest dimension.  9. The aortic valve is normal in structure. Aortic valve regurgitation is not visualized. No evidence of aortic valve sclerosis or stenosis. 10. The pulmonic valve was normal in structure. Pulmonic valve regurgitation is not visualized. 11. The inferior vena cava is normal in size with greater than 50% respiratory variability, suggesting right atrial pressure of 3 mmHg.   Subjective: "I think I got it."  Discharge Exam: Vitals:   04/23/19  2336 04/24/19 0418  BP: (!) 144/107 (!) 151/106  Pulse: (!) 104 (!) 110  Resp: 18 20  Temp: 98.1 F (36.7 C) 98.6 F (37 C)  SpO2: 100% 100%   Vitals:   04/23/19 1919 04/23/19 2336 04/24/19 0418 04/24/19 0620  BP: (!) 143/100 (!) 144/107 (!) 151/106   Pulse: (!) 103 (!) 104 (!) 110   Resp: '18 18 20   ' Temp: 98.2 F (36.8 C) 98.1 F (36.7 C) 98.6 F (37 C)   TempSrc: Oral Oral Oral   SpO2: 100% 100% 100%   Weight:    87.2 kg  Height:        General: 48 y.o. male resting in bed in NAD Cardiovascular: tachy, +S1, S2, no m/g/r, equal pulses Respiratory: CTABL, no w/r/r GI: BS+, distended, tender LLQ MSK: No e/c/c Neuro: A&O x 3, no focal deficits Psyc: calm/cooperative    The results of significant diagnostics from this hospitalization (including imaging, microbiology, ancillary and laboratory) are listed below for reference.      Microbiology: No results found for this or any previous visit (from the past 240 hour(s)).   Labs: BNP (last 3 results) No results for input(s): BNP in the last 8760 hours. Basic Metabolic Panel: Recent Labs  Lab 04/20/19 0320 04/21/19 0423 04/22/19 0306 04/23/19 1120 04/24/19 0312  NA 139 137 136 137 137  K 3.9 3.9 4.0 3.6 3.7  CL 110 104 106 107 106  CO2 '24 24 24 24 24  ' GLUCOSE 90 93 97 94 95  BUN 6 5* 5* <5* 5*  CREATININE 0.77 0.74 0.78 0.75 0.90  CALCIUM 8.0* 7.9* 8.0* 8.2* 8.1*  MG 1.4* 1.5* 1.6* 1.5* 1.5*  PHOS 3.5 3.6 3.8 3.7 3.9   Liver Function Tests: Recent Labs  Lab 04/20/19 0320 04/21/19 0423 04/22/19 0306 04/23/19 1120 04/24/19 0312  AST 56*  --   --   --  40  ALT 17  --   --   --  16  ALKPHOS 121  --   --   --  115  BILITOT 0.3  --   --   --  0.7  PROT 7.4  --   --   --  7.5  ALBUMIN 1.7* 1.7* 1.6* 1.8* 1.8*   No results for input(s): LIPASE, AMYLASE in the last 168 hours. No results for input(s): AMMONIA in the last 168 hours. CBC: Recent Labs  Lab 04/19/19 1145 04/20/19 0320 04/21/19 0424 04/22/19 0306 04/24/19 0312  WBC 6.4 7.4 6.1 5.7 5.7  NEUTROABS 4.4 5.0 4.0 3.6 3.5  HGB 6.8* 8.1* 7.7* 7.7* 7.5*  HCT 23.1* 27.1* 26.0* 25.3* 25.0*  MCV 79.9* 81.6 81.0 78.8* 80.6  PLT 375 368 325 316 291   Cardiac Enzymes: No results for input(s): CKTOTAL, CKMB, CKMBINDEX, TROPONINI in the last 168 hours. BNP: Invalid input(s): POCBNP CBG: Recent Labs  Lab 04/22/19 2310 04/23/19 0619 04/23/19 1212 04/23/19 1921 04/24/19 0001  GLUCAP 82 85 83 90 98   D-Dimer No results for input(s): DDIMER in the last 72 hours. Hgb A1c No results for input(s): HGBA1C in the last 72 hours. Lipid Profile Recent Labs    04/24/19 0312  TRIG 71   Thyroid function studies No results for input(s): TSH, T4TOTAL, T3FREE, THYROIDAB in the last 72 hours.  Invalid input(s): FREET3 Anemia work up No results for input(s): VITAMINB12, FOLATE, FERRITIN,  TIBC, IRON, RETICCTPCT in the last 72 hours. Urinalysis No results found for: COLORURINE, APPEARANCEUR, [place], [place], [place], [place],  BILIRUBINUR, KETONESUR, PROTEINUR, UROBILINOGEN, NITRITE, LEUKOCYTESUR Sepsis Labs Invalid input(s): PROCALCITONIN,  WBC,  LACTICIDVEN Microbiology No results found for this or any previous visit (from the past 240 hour(s)).   Time coordinating discharge: 35 minutes  SIGNED:   Jonnie Finner, DO  [facility] 04/24/2019, 7:04 AM Pager   If 7PM-7AM, please contact night-coverage [email or web address] Password [email or web address]

## 2019-04-24 NOTE — Progress Notes (Signed)
   04/24/19 1014  Vitals  BP (!) 138/102  MAP (mmHg) 114  BP Method Automatic  Pulse Rate (!) 112  Pulse Rate Source Monitor  Oxygen Therapy  SpO2 99 %  MEWS Score  MEWS RR 0  MEWS Pulse 2  MEWS Systolic 0  MEWS LOC 0  MEWS Temp 0  MEWS Score 2  MEWS Score Color Yellow

## 2019-04-24 NOTE — Progress Notes (Signed)
Pharmacy Antibiotic Note  Charles Calhoun is a 48 y.o. male with MRSA bacteremia. Pharmacy is consulted for vancomycin.    TEE this am shows vegetation on tricuspid valve  WBC 5.7, afebrile; Scr 0.9, CrCl >100 ml/min, renal function stable  No dose adjustments today.   Plan: Continue vancomycin 1 g IV Q 12 hrs Consider repeating vancomycin levels later this week  Height: 6' (182.9 cm) Weight: 192 lb 4.8 oz (87.2 kg) IBW/kg (Calculated) : 77.6  Temp (24hrs), Avg:98.2 F (36.8 C), Min:98 F (36.7 C), Max:98.6 F (37 C)  Recent Labs  Lab 04/17/19 1620 04/17/19 2216  04/19/19 1145 04/20/19 0320 04/21/19 0423 04/21/19 0424 04/21/19 1730 04/21/19 2035 04/22/19 0306 04/23/19 1120 04/24/19 0312  WBC  --   --    < > 6.4 7.4  --  6.1  --   --  5.7  --  5.7  CREATININE  --   --    < > 0.67 0.77 0.74  --   --   --  0.78 0.75 0.90  VANCOTROUGH  --  19  --   --   --   --   --  15  --   --   --   --   VANCOPEAK 34  --   --   --   --   --   --   --  31  --   --   --    < > = values in this interval not displayed.    Estimated Creatinine Clearance: 107.8 mL/min (by C-G formula based on SCr of 0.9 mg/dL).    No Known Allergies  Antimicrobials this admission: Meropenem 10/29 >> 10/30 Zosyn 10/29 at [place] 10/29 >>  CTX 10/30>>11/9 Flagyl 10/30>>11/9 Augmentin 11/10>>  Microbiology results: 10/31: BC - NG/final OSH: 3/4 BC positive for GPC  10/30 Ucx: NG/final 10/30 Bcx: MRSA 10/29 MRSA PCR positive   Erin Hearing PharmD., BCPS Clinical Pharmacist 04/24/2019 11:42 AM

## 2019-04-24 NOTE — Progress Notes (Signed)
Discharge education and medication education given to patient  with teach back; patient's mother at bedside. All questions and concerns answered.  Telemetry leads removed. All patient belongings given to patient, these include: glasses, clothing, wallet, cell phone, medications, and phone charger. IV team capped PICC line; patient discharged with PICC line for antibiotics. Carolynn Sayers, [facility] nurse spoke with patient regarding discharge instructions for PICC line and antibiotic education. Medication brought to room by transition of care pharmacy. Patient transported to main entrance by nurse via wheelchair.

## 2020-12-01 IMAGING — DX DG CHEST 1V PORT
1 series · 1 of 1 positions shown · non-contrast
Comparison: None.

CLINICAL DATA: Tachypnea.

EXAM:
PORTABLE CHEST 1 VIEW

[chest ap]
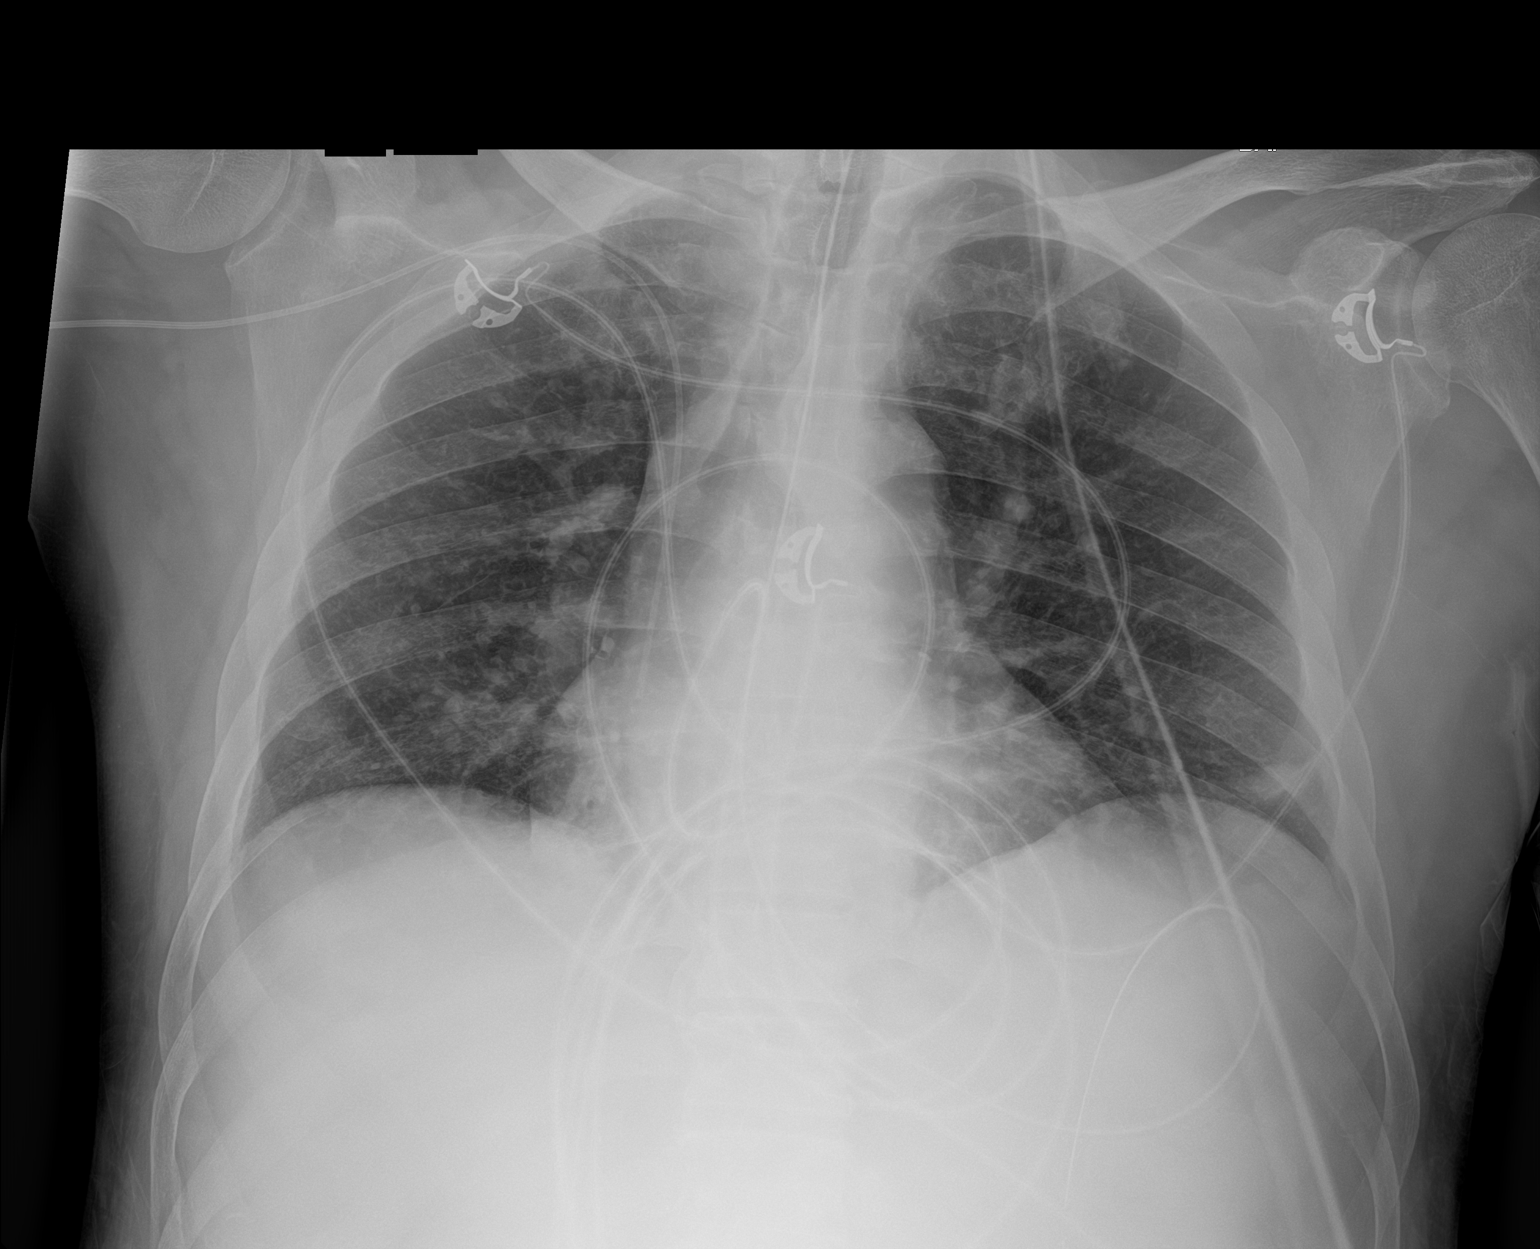

[1 of 1 positions shown; findings below may reference images not displayed]

FINDINGS: Enteric tube coiled in the stomach. Right upper extremity PICC line
with the tip at the cavoatrial junction. The heart size and
mediastinal contours are within normal limits. Normal pulmonary
vascularity. Low lung volumes. Minimal atelectasis at the peripheral
left lung base. No focal consolidation, pleural effusion, or
pneumothorax. No acute osseous abnormality.
IMPRESSION: 1. Low lung volumes.  No active disease.

## 2020-12-03 IMAGING — CT CT ABD-PEL WO/W CM
2 of 11 series · 14 of 46 positions shown, 16 images · IV contrast (omnipaque)
Comparison: None.

CLINICAL DATA: Acute pancreatitis. Fever and leukocytosis. Sepsis.
Cirrhosis.

EXAM:
CT ABDOMEN AND PELVIS WITHOUT AND WITH CONTRAST
TECHNIQUE: Multidetector CT imaging of the abdomen and pelvis was performed
following the standard protocol before and following the bolus
administration of intravenous contrast.
CONTRAST:  100mL OMNIPAQUE IOHEXOL 300 MG/ML  SOLN

[Series 8: arterial 2.0 cor · coronal · arterial · 0.65mm/px · 3 of 141 slices shown]
[im 36/141  soft-tissue]
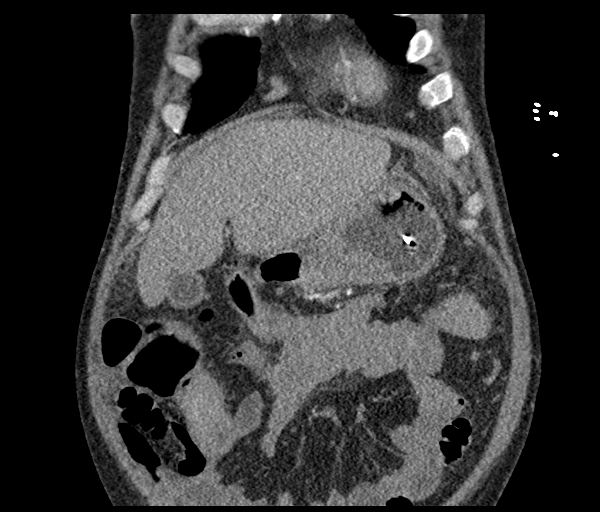
[im 71/141  soft-tissue]
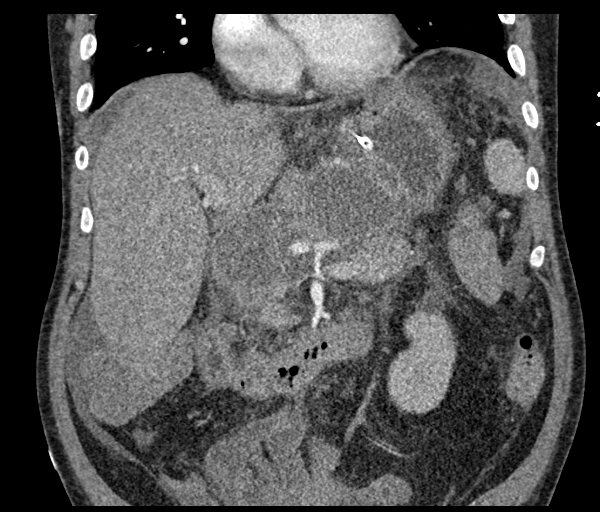
[im 106/141  soft-tissue]
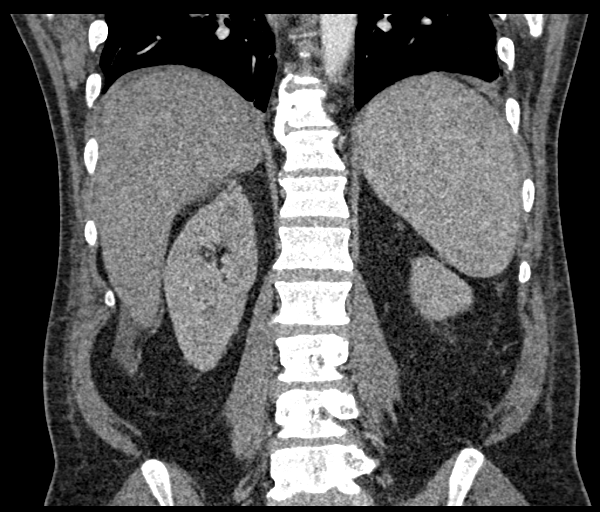

[Series 15: venous 2.0 · axial · portal-venous · 0.89mm/px · z∈[+914,+1338]mm · 11 of 256 slices shown, 13 images]
[im 22/256  soft-tissue]
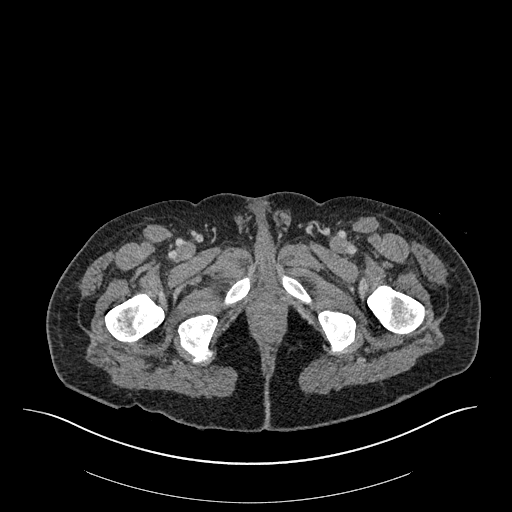
[im 22/256  bone]
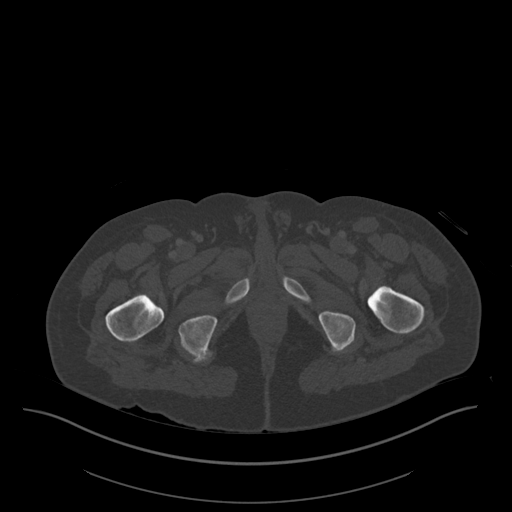
[im 43/256  soft-tissue]
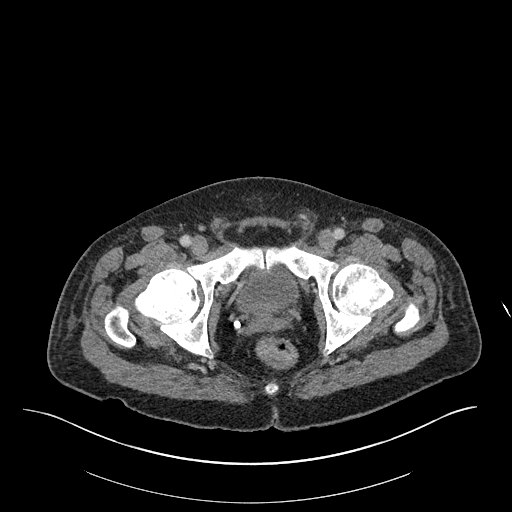
[im 64/256  soft-tissue]
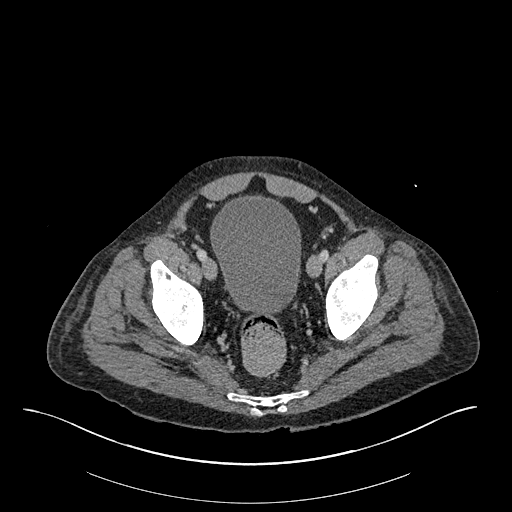
[im 86/256  soft-tissue]
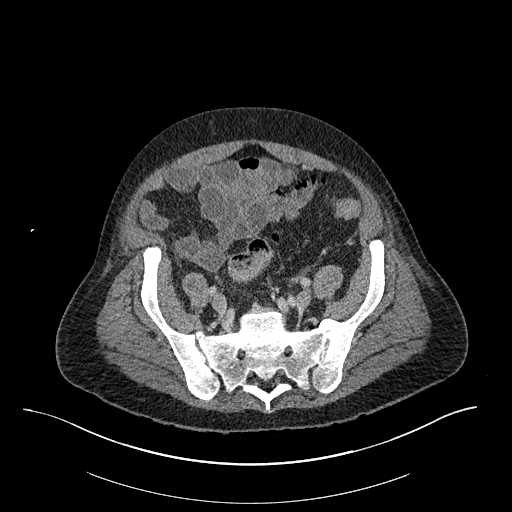
[im 107/256  soft-tissue]
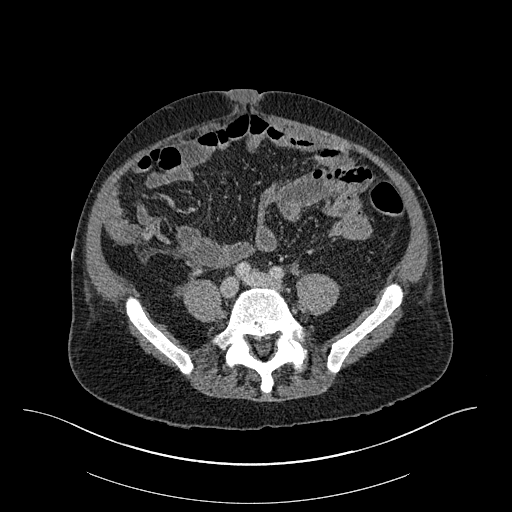
[im 128/256  soft-tissue]
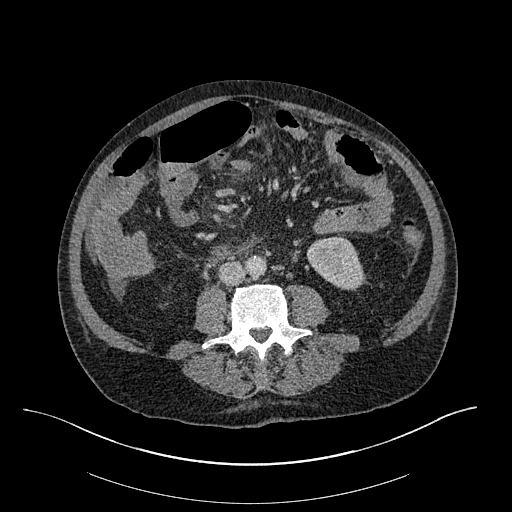
[im 149/256  soft-tissue]
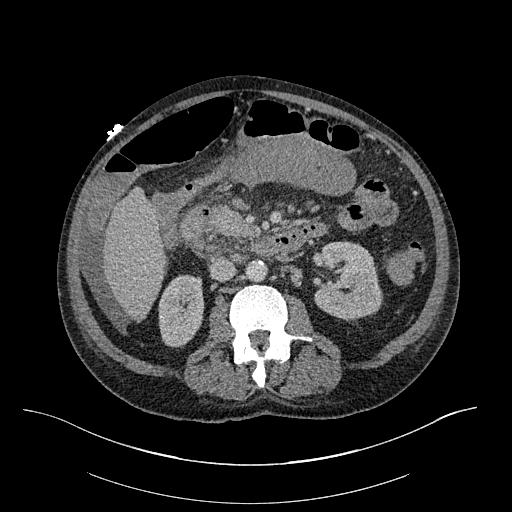
[im 171/256  soft-tissue]
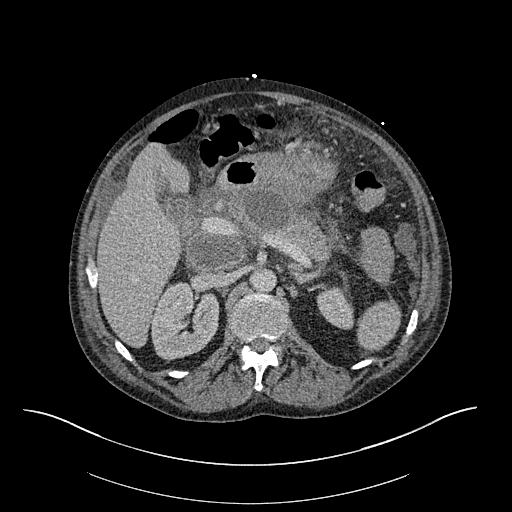
[im 192/256  soft-tissue]
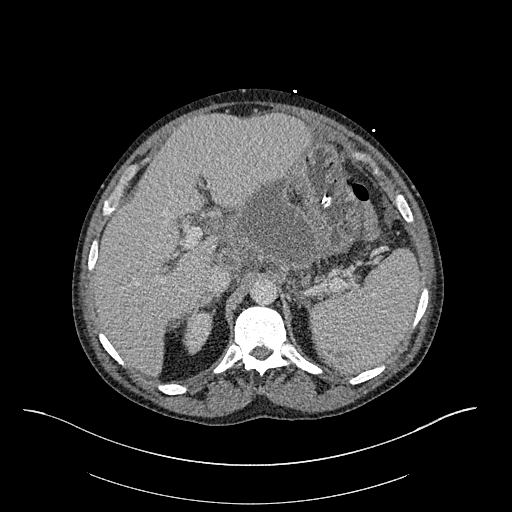
[im 192/256  bone]
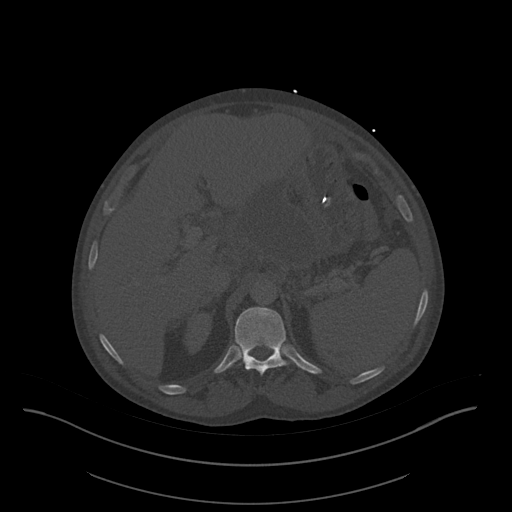
[im 213/256  soft-tissue]
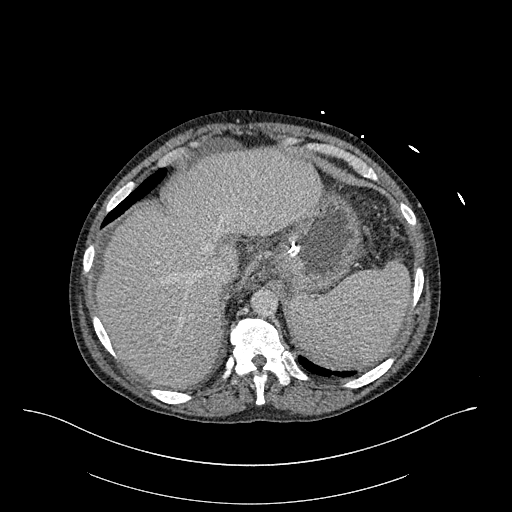
[im 234/256  soft-tissue]
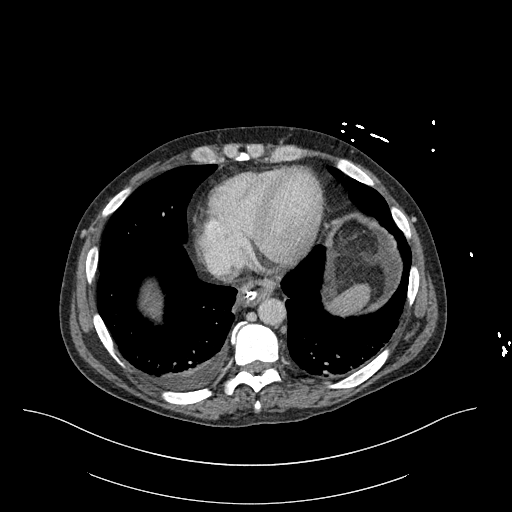

[14 of 46 positions shown; findings below may reference images not displayed]

FINDINGS: Lower Chest: Small right pleural effusion and dependent bibasilar
atelectasis.

Hepatobiliary: Hepatic cirrhosis is demonstrated. Tiny cysts seen in
central right hepatic lobe. No liver masses identified. Portal and
hepatic veins are patent. Recanalization of paraumbilical veins is
consistent with portal venous hypertension.

Pancreas: Mild-to-moderate acute pancreatitis is seen. No evidence
of pancreatic mass or calcifications. A rim enhancing fluid
collection is seen along the superior and anterior portion of the
pancreas which measures 9.8 x 6.2 cm. A smaller rim enhancing fluid
collection is seen along the posterior aspect of the pancreatic head
which measures 5.6 x 3.5 cm. A smaller 2 cm rim enhancing fluid
collection is seen along the posterior aspect of the pancreatic
tail. These are consistent with pancreatic pseudocysts.

Spleen: Mild splenomegaly measuring approximately 14 cm in length,
consistent with portal venous hypertension. No evidence of splenic
vein thrombosis. Portosystemic venous collaterals noted in the left
upper quadrant, consistent with portal venous hypertension. Multiple
small ill-defined less than 1 cm low-attenuation lesions are seen in
the posterior aspect of the spleen, which are nonspecific.
Differential considerations include small splenic infarcts and micro
abscesses.

Adrenals/Urinary Tract: No masses identified. No evidence of
hydronephrosis.

Stomach/Bowel: Nasogastric tube tip in body of stomach. No evidence
of obstruction, inflammatory process or abnormal fluid collections.

Vascular/Lymphatic: Shotty lymph nodes measuring up to 1 cm are seen
in the retroperitoneum and central small bowel mesentery, likely
reactive in etiology. No pseudoaneurysms visualized. Aortic
atherosclerosis. No evidence of abdominal aortic aneurysm.

Reproductive:  No mass or other significant abnormality.

Other:  Mild abdominal ascites.

Musculoskeletal:  No suspicious bone lesions identified.
IMPRESSION: Mild-to-moderate acute pancreatitis. Several pancreatic pseudocysts,
largest measuring 9.8 cm. No evidence of pseudoaneurysm or other
complication.

Hepatic cirrhosis and findings of portal venous hypertension,
including mild splenomegaly and mild ascites. No evidence of hepatic
neoplasm.

Multiple sub-cm low-attenuation lesions in the posterior spleen
which are nonspecific. Differential diagnosis includes small splenic
infarcts and micro abscesses. a

Shotty mesenteric and retroperitoneal lymphadenopathy, likely
reactive in etiology. Recommend continued attention on follow-up
imaging.

Small right pleural effusion and dependent bibasilar atelectasis.

Aortic Atherosclerosis ([id number]-[id number].R).

## 2020-12-07 IMAGING — DX DG ABD PORTABLE 1V
1 series · 1 of 1 positions shown · non-contrast
Comparison: 04/10/2019

CLINICAL DATA: NG tube placement

EXAM:
PORTABLE ABDOMEN - 1 VIEW

[abdomen kub]
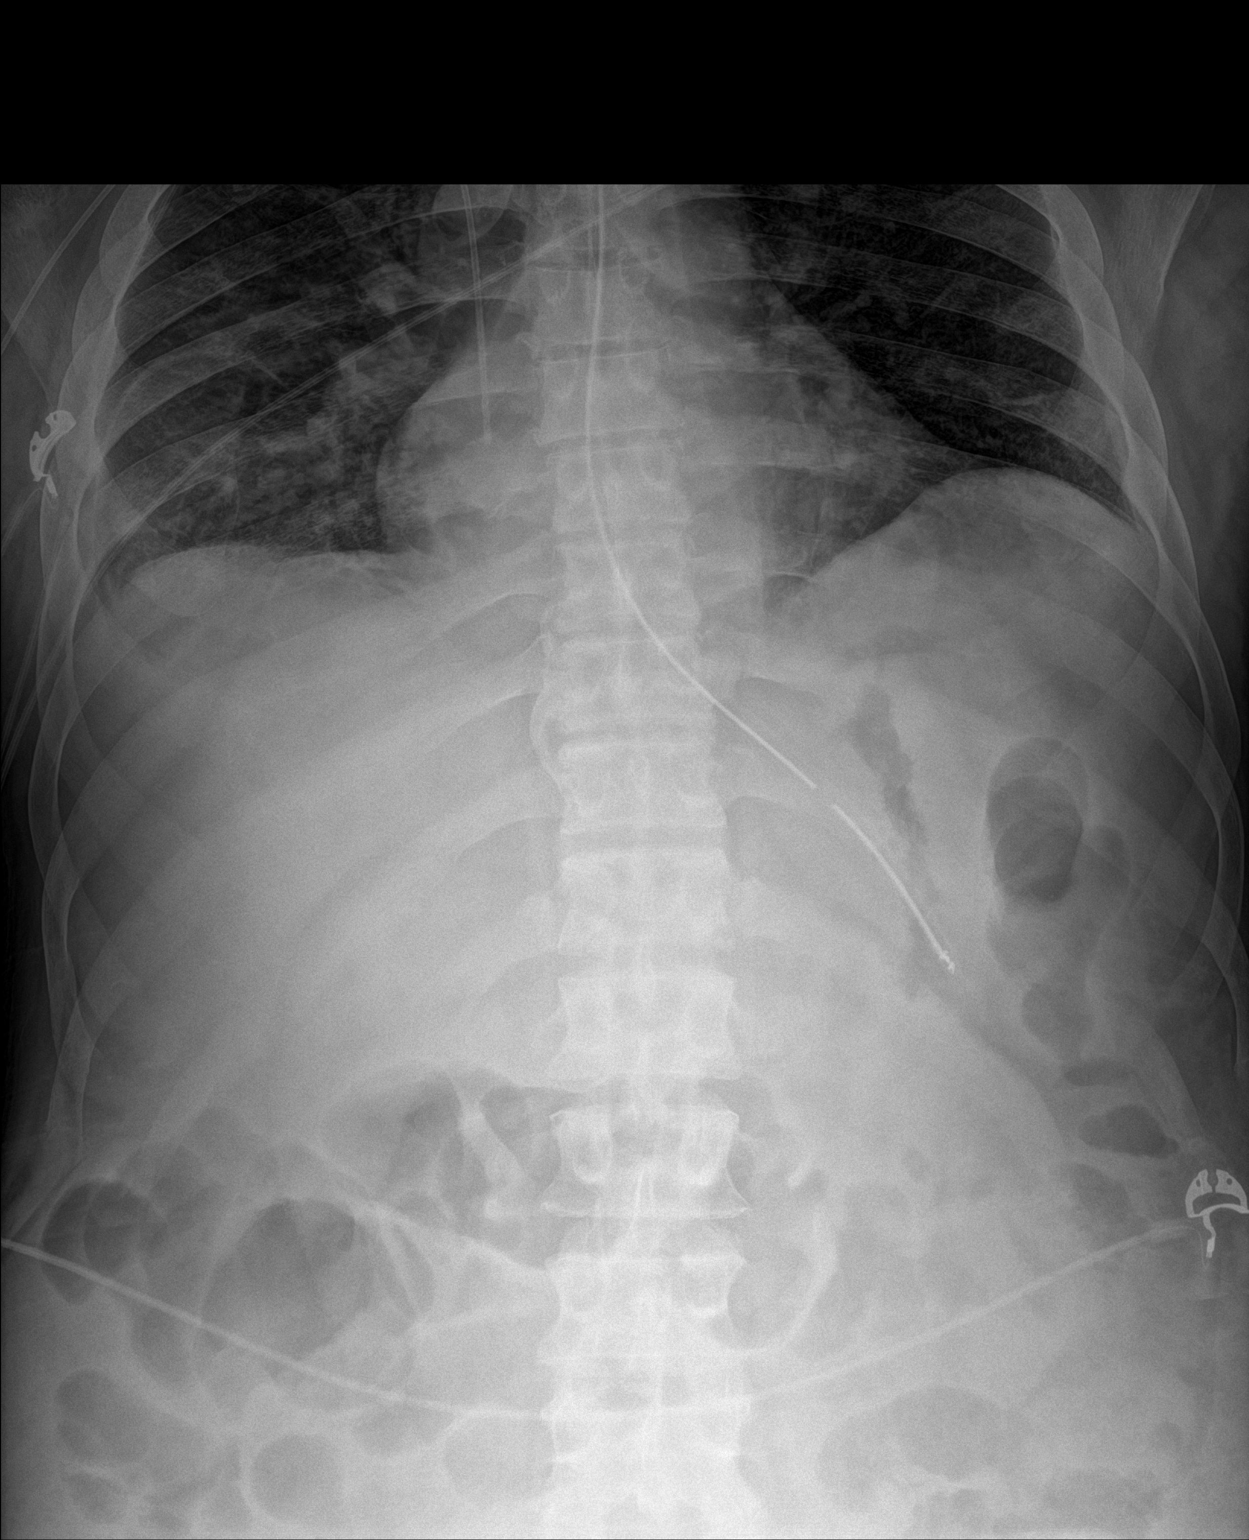

[1 of 1 positions shown; findings below may reference images not displayed]

FINDINGS: NG tube tip is in the proximal to mid stomach.
IMPRESSION: NG tube tip in the proximal to mid stomach.

## 2022-08-07 DEATH — deceased
